# Patient Record
Sex: Male | Born: 1940
Health system: Southern US, Community
[De-identification: ages and names within clinical notes are randomized; demographics above are authoritative.]

## PROBLEM LIST (undated history)

## (undated) DIAGNOSIS — E785 Hyperlipidemia, unspecified: Secondary | ICD-10-CM

## (undated) DIAGNOSIS — I10 Essential (primary) hypertension: Secondary | ICD-10-CM

---

## 2008-03-23 ENCOUNTER — Ambulatory Visit: Payer: Self-pay | Admitting: Occupational Medicine

## 2008-03-23 DIAGNOSIS — I10 Essential (primary) hypertension: Secondary | ICD-10-CM | POA: Insufficient documentation

## 2008-03-23 DIAGNOSIS — E785 Hyperlipidemia, unspecified: Secondary | ICD-10-CM | POA: Insufficient documentation

## 2008-03-23 DIAGNOSIS — S42199A Fracture of other part of scapula, unspecified shoulder, initial encounter for closed fracture: Secondary | ICD-10-CM

## 2008-03-31 ENCOUNTER — Encounter: Admission: RE | Admit: 2008-03-31 | Discharge: 2008-03-31 | Payer: Self-pay | Admitting: Sports Medicine

## 2008-04-21 ENCOUNTER — Encounter: Admission: RE | Admit: 2008-04-21 | Discharge: 2008-04-21 | Payer: Self-pay | Admitting: Sports Medicine

## 2013-02-18 ENCOUNTER — Encounter: Payer: Self-pay | Admitting: Emergency Medicine

## 2013-02-18 ENCOUNTER — Emergency Department (INDEPENDENT_AMBULATORY_CARE_PROVIDER_SITE_OTHER)
Admission: EM | Admit: 2013-02-18 | Discharge: 2013-02-18 | Disposition: A | Discharge status: Home / Self Care | Payer: Medicare Other | Source: Home / Self Care | Attending: Emergency Medicine | Admitting: Emergency Medicine

## 2013-02-18 ENCOUNTER — Emergency Department (INDEPENDENT_AMBULATORY_CARE_PROVIDER_SITE_OTHER): Payer: Medicare Other

## 2013-02-18 ENCOUNTER — Ambulatory Visit (INDEPENDENT_AMBULATORY_CARE_PROVIDER_SITE_OTHER): Payer: Medicare Other | Admitting: Sports Medicine

## 2013-02-18 DIAGNOSIS — M25062 Hemarthrosis, left knee: Secondary | ICD-10-CM

## 2013-02-18 DIAGNOSIS — S8002XA Contusion of left knee, initial encounter: Secondary | ICD-10-CM

## 2013-02-18 DIAGNOSIS — M25069 Hemarthrosis, unspecified knee: Secondary | ICD-10-CM

## 2013-02-18 DIAGNOSIS — M6281 Muscle weakness (generalized): Secondary | ICD-10-CM | POA: Insufficient documentation

## 2013-02-18 DIAGNOSIS — R296 Repeated falls: Secondary | ICD-10-CM

## 2013-02-18 DIAGNOSIS — M25469 Effusion, unspecified knee: Secondary | ICD-10-CM

## 2013-02-18 DIAGNOSIS — S8000XA Contusion of unspecified knee, initial encounter: Secondary | ICD-10-CM

## 2013-02-18 HISTORY — DX: Hyperlipidemia, unspecified: E78.5

## 2013-02-18 HISTORY — DX: Essential (primary) hypertension: I10

## 2013-02-18 NOTE — ED Provider Notes (Addendum)
CSN: [id number]     Arrival date & time 02/18/13  0807 History   First MD Initiated Contact with Patient 02/18/13 [phone]     Chief Complaint  Patient presents with  . Knee Injury   Patient is a 73 y.o. male presenting with knee pain. The history is provided by the patient and the spouse.  Knee Pain Location:  Knee Time since incident:  2 days Injury: yes   Mechanism of injury: fall   Mechanism of injury comment:  Fell on left anterior knee or walking on carpet Fall:    Fall occurred:  Tripped   Impact surface:  Carpet   Entrapped after fall: no   Knee location:  L knee Pain details:    Quality:  Dull   Radiates to:  Does not radiate   Severity:  Moderate   Timing:  Intermittent   Progression:  Worsening Chronicity:  New Prior injury to area:  No Relieved by:  Rest and ice Worsened by:  Bearing weight Associated symptoms: decreased ROM, muscle weakness (Chronic neuropathy left leg since 2001, followed by neurologist), stiffness and swelling   Associated symptoms: no back pain, no fever, no itching and no neck pain   Risk factors: no obesity and no recent illness    He states he is on blood thinners. Aspirin 81 mg daily and Ticagrelor Past Medical History  Diagnosis Date  . Hypertension   . Hyperlipidemia    History reviewed. No pertinent past surgical history. No family history on file. History  Substance Use Topics  . Smoking status: Former Smoker    Types: Cigarettes    Quit date: 02/18/1973  . Smokeless tobacco: Not on file  . Alcohol Use: Yes    Review of Systems  Constitutional: Negative for fever.  Musculoskeletal: Positive for stiffness. Negative for back pain and neck pain.  Skin: Negative for itching.  All other systems reviewed and are negative.    Allergies  Review of patient's allergies indicates no known allergies.  Home Medications   Current Outpatient Rx  Name  Route  Sig  Dispense  Refill  . aspirin 81 MG tablet   Oral   Take 81 mg by  mouth daily.         [name] atenolol (TENORMIN) 50 MG tablet   Oral   Take 50 mg by mouth daily.         [name] atorvastatin (LIPITOR) 80 MG tablet   Oral   Take 80 mg by mouth daily.         . sertraline (ZOLOFT) 25 MG tablet   Oral   Take 25 mg by mouth daily.         . Ticagrelor (BRILINTA) 90 MG TABS tablet   Oral   Take by mouth 2 (two) times daily.          BP 130/84  Pulse 53  Temp(Src) 98.6 F (37 C) (Oral)  Ht 6\' 1"  (1.854 m)  Wt 186 lb (84.369 kg)  BMI 24.55 kg/m2  SpO2 96% Physical Exam  Nursing note and vitals reviewed. Constitutional: He is oriented to person, place, and time. He appears well-developed and well-nourished. No distress.  Uncomfortable from left knee pain.  HENT:  Head: Normocephalic and atraumatic.  Eyes: Conjunctivae and EOM are normal. Pupils are equal, round, and reactive to light. No scleral icterus.  Neck: Normal range of motion.  Cardiovascular: Normal rate.   Pulmonary/Chest: Effort normal.  Abdominal: He exhibits no distension.  Musculoskeletal:  Left knee: He exhibits decreased range of motion and swelling (4+). He exhibits no laceration. Tenderness (Diffusely, especially patella) found. No medial joint line, no lateral joint line and no patellar tendon tenderness noted.  Some chronic atrophy of left quadriceps muscle, which she states is from chronic neuropathy of left leg.  Left knee swollen, mildly discolored, minimal warmth .  Superficial abrasion left anterior knee, no drainage or bleeding  Neurological: He is alert and oriented to person, place, and time. No cranial nerve deficit.  Skin: Skin is warm.  Psychiatric: He has a normal mood and affect.   Vascular good capillary refill distally intact both extremities. No calf tenderness or cords. ED Course  Procedures (including critical care time) Labs Review Labs Reviewed - No data to display Imaging Review Dg Knee Complete 4 Views Left  02/18/2013   CLINICAL DATA:   Knee injury.  EXAM: LEFT KNEE - COMPLETE 4+ VIEW  COMPARISON:  None.  FINDINGS: Prominent knee joint effusion is present. Prominent tricompartment degenerative change. Femur and tibia are intact. Subtle fractures of the superior and or inferior aspects of the patella cannot be completely excluded. These changes may be related to ligamentous ossification . CT can be obtained for further evaluation.  IMPRESSION: 1. Large knee joint effusion. 2. Cannot exclude subtle fractures of the superior and/ or inferior aspect of the patella. CT can be obtained for further evaluation. 3. Severe tricompartment degenerative change.   Electronically Signed   By: Marcello Moores  Register   On: 02/18/2013 09:09    EKG Interpretation    Date/Time:    Ventricular Rate:    PR Interval:    QRS Duration:   QT Interval:    QTC Calculation:   R Axis:     Text Interpretation:              MDM   1. Contusion of left knee   2. Hemarthrosis of left knee    Severe left knee injury/contusion. With large joint effusion.--As he is on anticoagulant, I'm suspicious of a large hematoma/blood joint effusion, but other causes possible. We'll arrange stat referral to Dr. Dianah Field, sports medicine/outpatient orthopedic specialist, who will come to see patient in our facility for consultation.  Addendum: 02/18/2013  1140 Dr. Dianah Field came to our urgent care facility and evaluated patient and reviewed x-rays and he performed arthrocentesis under ultrasound guidance, and removed 80 cc of bloody fluid. And he also injected a steroid/anesthetic into left knee joint space and patient did well and his pain was significantly improved and the swelling was much improved. The Ace bandage and knee immobilizer applied. He declined any prescription pain medication and she prefers to use Tylenol as his pain is much improved after the procedure by Dr. Dianah Field. He'll followup in 2 weeks with Dr. Dianah Field . Precautions  discussed. Red flags discussed. Questions invited and answered. Patient and wife voiced understanding and agreement.    Jacqulyn Cane, MD 02/18/13 1225  Over 45 minutes spent, greater than 50% of the time spent for counseling and coordination of care.   Jacqulyn Cane, MD 02/18/13 1229

## 2013-02-18 NOTE — Assessment & Plan Note (Signed)
This represents osteoarthritis, with posttraumatic hemarthrosis. I aspirated 80 cc of gross blood out of the knee, and then injected. The was then strapped with compressive dressing and placed in a knee immobilizer. He will come back to see me in 3 weeks.

## 2013-02-18 NOTE — ED Notes (Signed)
Left knee injury from fall yesterday while walking up a ramp

## 2013-02-18 NOTE — Progress Notes (Addendum)
   Subjective:    I'm seeing this patient as a consultation for:  Dr. Burnett Harry  CC: Left knee pain  HPI: This is a very pleasant 73 year old male with a baseline history of lower extremity neuropathy with essentially 0/5 baseline knee extension strength bilaterally, as well as bilateral foot drop. He typically ambulates with a cane. Recently he fell directly onto his left knee causing immediate pain, and swelling. He came here for further evaluation and definitive treatment. Pain is moderate, persistent, localized in the suprapatellar recess at the posterior aspect of the knee.  Past medical history, Surgical history, Family history not pertinant except as noted below, Social history, Allergies, and medications have been entered into the medical record, reviewed, and no changes needed.   Review of Systems: No headache, visual changes, nausea, vomiting, diarrhea, constipation, dizziness, abdominal pain, skin rash, fevers, chills, night sweats, weight loss, swollen lymph nodes, body aches, joint swelling, muscle aches, chest pain, shortness of breath, mood changes, visual or auditory hallucinations.   Objective:   General: Well Developed, well nourished, and in no acute distress.  Neuro/Psych: Alert and oriented x3, extra-ocular muscles intact, able to move all 4 extremities, sensation grossly intact. Skin: Warm and dry, no rashes noted.  Respiratory: Not using accessory muscles, speaking in full sentences, trachea midline.  Cardiovascular: Pulses palpable, no extremity edema. Abdomen: Does not appear distended. Left knee: Moderate effusion palpable and visible, there is a fluid wave, he is tender to palpation along the anteromedial joint line, there is no tenderness along the superior or inferior poles or the body of the patella. He has 0/5 extension strength on both knees. There are abrasions on both the left and the right knees, these were dressed with antibiotic ointment.  Procedure: Limited  ultrasound of left knee Device: GE logiq E. Findings: There is a large effusion of hypoechoic material suggestive of hemarthrosis. Patellar tendon is intact and there are no visible fractures around the kneecap. Quadriceps tendon and its insertion are also intact Impression: Hemarthrosis with intact extensor apparatus. Images permanently stored in the unit and are available for review.  Procedure: Real-time Ultrasound Guided aspiration/Injection of left knee Device: GE Logiq E  Verbal informed consent obtained.  Time-out conducted.  Noted no overlying erythema, induration, or other signs of local infection.  Skin prepped in a sterile fashion.  Local anesthesia: Topical Ethyl chloride.  With sterile technique and under real time ultrasound guidance:  22-gauge needle advanced into the suprapatellar recess, 80 cc of frank blood was aspirated, syringe switched and 2 cc Kenalog 40, 4 cc lidocaine injected easily. Completed without difficulty  Pain immediately resolved suggesting accurate placement of the medication.  Advised to call if fevers/chills, erythema, induration, drainage, or persistent bleeding.  Images permanently stored and available for review in the ultrasound unit.  Impression: Technically successful ultrasound guided injection.  The knee was then strapped compressive dressing.  Impression and Recommendations:   This case required medical decision making of moderate complexity.  I spent 60 minutes with this patient, greater than 50% was face-to-face time counseling regarding the above diagnosis, time spent was performing initial assessment, and management before, and after the injection but not during.

## 2013-02-19 ENCOUNTER — Institutional Professional Consult (permissible substitution): Payer: Medicare Other | Admitting: Sports Medicine

## 2013-03-11 ENCOUNTER — Ambulatory Visit (INDEPENDENT_AMBULATORY_CARE_PROVIDER_SITE_OTHER): Payer: Medicare Other | Admitting: Sports Medicine

## 2013-03-11 ENCOUNTER — Encounter: Payer: Self-pay | Admitting: Sports Medicine

## 2013-03-11 VITALS — BP 120/81 | HR 60 | Ht 73.0 in | Wt 189.0 lb

## 2013-03-11 DIAGNOSIS — M25062 Hemarthrosis, left knee: Secondary | ICD-10-CM

## 2013-03-11 DIAGNOSIS — M25069 Hemarthrosis, unspecified knee: Secondary | ICD-10-CM

## 2013-03-11 NOTE — Assessment & Plan Note (Signed)
Pain-free. Return as needed.

## 2013-03-11 NOTE — Progress Notes (Signed)
  Subjective:    CC: Followup  HPI: Left knee: Russell Neal returns after aspiration and injection of a traumatic left knee hemarthrosis with underlying osteoarthritis. He is completely pain-free.  Past medical history, Surgical history, Family history not pertinant except as noted below, Social history, Allergies, and medications have been entered into the medical record, reviewed, and no changes needed.   Review of Systems: No fevers, chills, night sweats, weight loss, chest pain, or shortness of breath.   Objective:    General: Well Developed, well nourished, and in no acute distress.  Neuro: Alert and oriented x3, extra-ocular muscles intact, sensation grossly intact.  HEENT: Normocephalic, atraumatic, pupils equal round reactive to light, neck supple, no masses, no lymphadenopathy, thyroid nonpalpable.  Skin: Warm and dry, no rashes. Cardiac: Regular rate and rhythm, no murmurs rubs or gallops, no lower extremity edema.  Respiratory: Clear to auscultation bilaterally. Not using accessory muscles, speaking in full sentences. Left Knee: Normal to inspection with no erythema or effusion or obvious bony abnormalities. Palpation normal with no warmth, joint line tenderness, patellar tenderness, or condyle tenderness. ROM full in flexion and extension and lower leg rotation. Ligaments with solid consistent endpoints including ACL, PCL, LCL, MCL. Negative Mcmurray's, Apley's, and Thessalonian tests. Non painful patellar compression. Patellar glide without crepitus. Patellar and quadriceps tendons unremarkable. Hamstring and quadriceps strength is normal.   Impression and Recommendations:

## 2013-12-30 ENCOUNTER — Ambulatory Visit (INDEPENDENT_AMBULATORY_CARE_PROVIDER_SITE_OTHER): Payer: Medicare Other | Admitting: Sports Medicine

## 2013-12-30 ENCOUNTER — Encounter: Payer: Self-pay | Admitting: Sports Medicine

## 2013-12-30 VITALS — BP 129/82 | HR 55 | Ht 73.0 in | Wt 187.0 lb

## 2013-12-30 DIAGNOSIS — M7021 Olecranon bursitis, right elbow: Secondary | ICD-10-CM

## 2013-12-30 NOTE — Progress Notes (Signed)
  Subjective:    CC: Right elbow swelling  HPI: This is a very pleasant 73 year old male, he does take Brilinta, he recently had a fall onto his right elbow that resulted in immediate swelling over the olecranon. Right now it's really not painful does have good use of his elbow. Symptoms are moderate, persistent. no radiation.  Past medical history, Surgical history, Family history not pertinant except as noted below, Social history, Allergies, and medications have been entered into the medical record, reviewed, and no changes needed.   Review of Systems: No fevers, chills, night sweats, weight loss, chest pain, or shortness of breath.   Objective:    General: Well Developed, well nourished, and in no acute distress.  Neuro: Alert and oriented x3, extra-ocular muscles intact, sensation grossly intact.  HEENT: Normocephalic, atraumatic, pupils equal round reactive to light, neck supple, no masses, no lymphadenopathy, thyroid nonpalpable.  Skin: Warm and dry, no rashes. Cardiac: Regular rate and rhythm, no murmurs rubs or gallops, no lower extremity edema.  Respiratory: Clear to auscultation bilaterally. Not using accessory muscles, speaking in full sentences. Left Elbow: Swollen olecranon bursa Range of motion full pronation, supination, flexion, extension. Strength is full to all of the above directions Stable to varus, valgus stress. Negative moving valgus stress test. No discrete areas of tenderness to palpation. Ulnar nerve does not sublux. Negative cubital tunnel Tinel's.  Procedure: Real-time Ultrasound Guided Injection of right olecranon bursa Device: GE Logiq E  Ultrasound guided injection is preferred based studies that show increased duration, increased effect, greater accuracy, decreased procedural pain, increased response rate, and decreased cost with ultrasound guided versus blind injection.  Verbal informed consent obtained.  Time-out conducted.  Noted no overlying  erythema, induration, or other signs of local infection.  Skin prepped in a sterile fashion.  Local anesthesia: Topical Ethyl chloride.  With sterile technique and under real time ultrasound guidance:  Aspirate 18cc frank blood from the bursa, syringe switched and 1cc kenalog 40, 1cc lidocaine injected easily. Completed without difficulty  Pain immediately resolved suggesting accurate placement of the medication.  Advised to call if fevers/chills, erythema, induration, drainage, or persistent bleeding.  Images permanently stored and available for review in the ultrasound unit.  Impression: Technically successful ultrasound guided injection.  Right elbow was then strapped with compressive dressing.  Impression and Recommendations:

## 2013-12-30 NOTE — Assessment & Plan Note (Addendum)
Likely hemorrhagic bursitis in a patient with trauma and/or blood thinners. Aspiration of 18 mL of frank blood from the olecranon bursa and injection as above, strapped with compressive dressing. Return to see me in one month.

## 2014-02-03 ENCOUNTER — Ambulatory Visit (INDEPENDENT_AMBULATORY_CARE_PROVIDER_SITE_OTHER): Payer: Medicare Other | Admitting: Sports Medicine

## 2014-02-03 ENCOUNTER — Encounter: Payer: Self-pay | Admitting: Sports Medicine

## 2014-02-03 VITALS — BP 134/78 | HR 54 | Wt 187.0 lb

## 2014-02-03 DIAGNOSIS — M25462 Effusion, left knee: Secondary | ICD-10-CM

## 2014-02-03 DIAGNOSIS — M7042 Prepatellar bursitis, left knee: Secondary | ICD-10-CM | POA: Insufficient documentation

## 2014-02-03 DIAGNOSIS — M7021 Olecranon bursitis, right elbow: Secondary | ICD-10-CM

## 2014-02-03 NOTE — Assessment & Plan Note (Signed)
Completely resolved after aspiration and injection. It was atraumatic, hemorrhagic bursitis.

## 2014-02-03 NOTE — Progress Notes (Signed)
  Subjective:    CC: Follow-up  HPI: This is a pleasant 74 year old male, I saw him a month ago for a hemorrhagic right olecranon bursitis which we aspirated and injected, this resolved. Unfortunately he had another fall onto his left knee, he developed swelling over the anterior aspect of the kneecap that was moderate, persistent with regards to pain. It has improved slightly but he continues to have pain at the joint lines and anterior patella.  Past medical history, Surgical history, Family history not pertinant except as noted below, Social history, Allergies, and medications have been entered into the medical record, reviewed, and no changes needed.   Review of Systems: No fevers, chills, night sweats, weight loss, chest pain, or shortness of breath.   Objective:    General: Well Developed, well nourished, and in no acute distress.  Neuro: Alert and oriented x3, extra-ocular muscles intact, sensation grossly intact.  HEENT: Normocephalic, atraumatic, pupils equal round reactive to light, neck supple, no masses, no lymphadenopathy, thyroid nonpalpable.  Skin: Warm and dry, no rashes. Cardiac: Regular rate and rhythm, no murmurs rubs or gallops, no lower extremity edema.  Respiratory: Clear to auscultation bilaterally. Not using accessory muscles, speaking in full sentences. Right Elbow: Unremarkable to inspection. Range of motion full pronation, supination, flexion, extension. Strength is full to all of the above directions Stable to varus, valgus stress. Negative moving valgus stress test. No discrete areas of tenderness to palpation. Ulnar nerve does not sublux. Negative cubital tunnel Tinel's. Left Knee: Visible fullness and tenderness to palpation over the prepatellar bursa. There is also pain at the joint line. ROM normal in flexion and extension and lower leg rotation. Ligaments with solid consistent endpoints including ACL, PCL, LCL, MCL. Negative Mcmurray's and provocative  meniscal tests. Non painful patellar compression. Patellar and quadriceps tendons unremarkable. Hamstring and quadriceps strength is normal.  Procedure: Real-time Ultrasound Guided Injection of left prepatellar bursa Device: GE Logiq E  Verbal informed consent obtained.  Time-out conducted.  Noted no overlying erythema, induration, or other signs of local infection.  Skin prepped in a sterile fashion.  Local anesthesia: Topical Ethyl chloride.  With sterile technique and under real time ultrasound guidance:  0.5 mL kenalog 40, 0.5 mL lidocaine injected easily. Completed without difficulty  Pain immediately resolved suggesting accurate placement of the medication.  Advised to call if fevers/chills, erythema, induration, drainage, or persistent bleeding.  Images permanently stored and available for review in the ultrasound unit.  Impression: Technically successful ultrasound guided injection.  Procedure: Real-time Ultrasound Guided Injection of left knee Device: GE Logiq E  Verbal informed consent obtained.  Time-out conducted.  Noted no overlying erythema, induration, or other signs of local infection.  Skin prepped in a sterile fashion.  Local anesthesia: Topical Ethyl chloride.  With sterile technique and under real time ultrasound guidance: Noted intra-articular effusion, 0.5 mL kenalog 40, 0.5 mL lidocaine injected easily. Completed without difficulty  Pain immediately resolved suggesting accurate placement of the medication.  Advised to call if fevers/chills, erythema, induration, drainage, or persistent bleeding.  Images permanently stored and available for review in the ultrasound unit.  Impression: Technically successful ultrasound guided injection.  Impression and Recommendations:

## 2014-02-03 NOTE — Assessment & Plan Note (Addendum)
This represents Russell Neal's third fall. Injection of his left hemorrhagic prepatellar bursitis that has been present and persistent for one month. We also injected the knee joint as there was an effusion. I'm going to get him into formal physical therapy for balance and gait training. He will continue to use his cane.

## 2014-02-10 ENCOUNTER — Ambulatory Visit (INDEPENDENT_AMBULATORY_CARE_PROVIDER_SITE_OTHER): Payer: Medicare Other | Admitting: Physical Therapy

## 2014-02-10 DIAGNOSIS — M7042 Prepatellar bursitis, left knee: Secondary | ICD-10-CM | POA: Diagnosis not present

## 2014-02-10 DIAGNOSIS — M6281 Muscle weakness (generalized): Secondary | ICD-10-CM

## 2014-02-10 DIAGNOSIS — R262 Difficulty in walking, not elsewhere classified: Secondary | ICD-10-CM

## 2014-02-14 ENCOUNTER — Encounter (INDEPENDENT_AMBULATORY_CARE_PROVIDER_SITE_OTHER): Payer: Medicare Other | Admitting: Physical Therapy

## 2014-02-14 DIAGNOSIS — R262 Difficulty in walking, not elsewhere classified: Secondary | ICD-10-CM

## 2014-02-14 DIAGNOSIS — M7042 Prepatellar bursitis, left knee: Secondary | ICD-10-CM

## 2014-02-14 DIAGNOSIS — M6281 Muscle weakness (generalized): Secondary | ICD-10-CM

## 2014-02-17 ENCOUNTER — Encounter: Payer: Medicare Other | Admitting: Physical Therapy

## 2014-02-17 ENCOUNTER — Encounter (INDEPENDENT_AMBULATORY_CARE_PROVIDER_SITE_OTHER): Payer: Medicare Other | Admitting: Physical Therapy

## 2014-02-17 DIAGNOSIS — M6281 Muscle weakness (generalized): Secondary | ICD-10-CM | POA: Diagnosis not present

## 2014-02-17 DIAGNOSIS — M7042 Prepatellar bursitis, left knee: Secondary | ICD-10-CM | POA: Diagnosis not present

## 2014-02-17 DIAGNOSIS — R262 Difficulty in walking, not elsewhere classified: Secondary | ICD-10-CM

## 2014-03-10 ENCOUNTER — Ambulatory Visit (INDEPENDENT_AMBULATORY_CARE_PROVIDER_SITE_OTHER): Payer: Medicare Other | Admitting: Sports Medicine

## 2014-03-10 ENCOUNTER — Encounter: Payer: Self-pay | Admitting: Sports Medicine

## 2014-03-10 VITALS — BP 147/86 | HR 105 | Ht 73.0 in | Wt 187.0 lb

## 2014-03-10 DIAGNOSIS — M7042 Prepatellar bursitis, left knee: Secondary | ICD-10-CM

## 2014-03-10 DIAGNOSIS — M6281 Muscle weakness (generalized): Secondary | ICD-10-CM

## 2014-03-10 NOTE — Assessment & Plan Note (Signed)
Pain is now completely resolved after injection into a hemorrhagic prepatellar bursitis.  Return as needed.

## 2014-03-10 NOTE — Assessment & Plan Note (Signed)
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 He does well with a simple cane, we did discuss ankle foot orthoses as well as an artificial quadriceps orthosis, he is going to think about this. He has had several falls over the past few months.

## 2014-03-10 NOTE — Progress Notes (Signed)
  Subjective:    CC: Follow-up  HPI: Hemorrhagic olecranon bursitis: Resolved  Left hemorrhagic prepatellar bursitis: Resolved with aspiration and injection.  Left knee pain: Doing well, he does have bilateral quadriceps insufficiency as well as mild bilateral foot drop, I did recommend an artificial quadriceps orthosis as well as an ankle-foot orthosis for his foot drop, he is going to think about this.  Past medical history, Surgical history, Family history not pertinant except as noted below, Social history, Allergies, and medications have been entered into the medical record, reviewed, and no changes needed.   Review of Systems: No fevers, chills, night sweats, weight loss, chest pain, or shortness of breath.   Objective:    General: Well Developed, well nourished, and in no acute distress.  Neuro: Alert and oriented x3, extra-ocular muscles intact, sensation grossly intact.  HEENT: Normocephalic, atraumatic, pupils equal round reactive to light, neck supple, no masses, no lymphadenopathy, thyroid nonpalpable.  Skin: Warm and dry, no rashes. Cardiac: Regular rate and rhythm, no murmurs rubs or gallops, no lower extremity edema.  Respiratory: Clear to auscultation bilaterally. Not using accessory muscles, speaking in full sentences.  Impression and Recommendations:

## 2014-04-13 DIAGNOSIS — L570 Actinic keratosis: Secondary | ICD-10-CM | POA: Diagnosis not present

## 2014-04-13 DIAGNOSIS — Z08 Encounter for follow-up examination after completed treatment for malignant neoplasm: Secondary | ICD-10-CM | POA: Diagnosis not present

## 2014-04-13 DIAGNOSIS — Z85820 Personal history of malignant melanoma of skin: Secondary | ICD-10-CM | POA: Diagnosis not present

## 2014-04-18 DIAGNOSIS — H25013 Cortical age-related cataract, bilateral: Secondary | ICD-10-CM | POA: Diagnosis not present

## 2014-04-24 DIAGNOSIS — I10 Essential (primary) hypertension: Secondary | ICD-10-CM | POA: Diagnosis not present

## 2014-04-24 DIAGNOSIS — N529 Male erectile dysfunction, unspecified: Secondary | ICD-10-CM | POA: Diagnosis not present

## 2014-04-24 DIAGNOSIS — K219 Gastro-esophageal reflux disease without esophagitis: Secondary | ICD-10-CM | POA: Diagnosis not present

## 2014-04-24 DIAGNOSIS — Z79899 Other long term (current) drug therapy: Secondary | ICD-10-CM | POA: Diagnosis not present

## 2014-04-24 DIAGNOSIS — E039 Hypothyroidism, unspecified: Secondary | ICD-10-CM | POA: Diagnosis not present

## 2014-04-24 DIAGNOSIS — M545 Low back pain: Secondary | ICD-10-CM | POA: Diagnosis not present

## 2014-04-24 DIAGNOSIS — F17201 Nicotine dependence, unspecified, in remission: Secondary | ICD-10-CM | POA: Diagnosis not present

## 2014-04-24 DIAGNOSIS — M549 Dorsalgia, unspecified: Secondary | ICD-10-CM | POA: Diagnosis not present

## 2014-04-24 DIAGNOSIS — I739 Peripheral vascular disease, unspecified: Secondary | ICD-10-CM | POA: Diagnosis not present

## 2014-04-24 DIAGNOSIS — G629 Polyneuropathy, unspecified: Secondary | ICD-10-CM | POA: Diagnosis not present

## 2014-04-24 DIAGNOSIS — M47816 Spondylosis without myelopathy or radiculopathy, lumbar region: Secondary | ICD-10-CM | POA: Diagnosis not present

## 2014-04-24 DIAGNOSIS — M4316 Spondylolisthesis, lumbar region: Secondary | ICD-10-CM | POA: Diagnosis not present

## 2014-04-24 DIAGNOSIS — I340 Nonrheumatic mitral (valve) insufficiency: Secondary | ICD-10-CM | POA: Diagnosis not present

## 2014-04-24 DIAGNOSIS — I2510 Atherosclerotic heart disease of native coronary artery without angina pectoris: Secondary | ICD-10-CM | POA: Diagnosis not present

## 2014-04-24 DIAGNOSIS — E785 Hyperlipidemia, unspecified: Secondary | ICD-10-CM | POA: Diagnosis not present

## 2014-04-24 DIAGNOSIS — Z7982 Long term (current) use of aspirin: Secondary | ICD-10-CM | POA: Diagnosis not present

## 2014-06-23 DIAGNOSIS — E782 Mixed hyperlipidemia: Secondary | ICD-10-CM | POA: Diagnosis not present

## 2014-06-23 DIAGNOSIS — G7241 Inclusion body myositis [IBM]: Secondary | ICD-10-CM | POA: Diagnosis not present

## 2014-06-23 DIAGNOSIS — Z79899 Other long term (current) drug therapy: Secondary | ICD-10-CM | POA: Diagnosis not present

## 2014-06-23 DIAGNOSIS — R7302 Impaired glucose tolerance (oral): Secondary | ICD-10-CM | POA: Diagnosis not present

## 2014-06-23 DIAGNOSIS — E049 Nontoxic goiter, unspecified: Secondary | ICD-10-CM | POA: Diagnosis not present

## 2014-06-23 DIAGNOSIS — D519 Vitamin B12 deficiency anemia, unspecified: Secondary | ICD-10-CM | POA: Diagnosis not present

## 2014-06-28 DIAGNOSIS — Z23 Encounter for immunization: Secondary | ICD-10-CM | POA: Diagnosis not present

## 2014-06-28 DIAGNOSIS — E0590 Thyrotoxicosis, unspecified without thyrotoxic crisis or storm: Secondary | ICD-10-CM | POA: Diagnosis not present

## 2014-06-28 DIAGNOSIS — Z0001 Encounter for general adult medical examination with abnormal findings: Secondary | ICD-10-CM | POA: Diagnosis not present

## 2014-06-28 DIAGNOSIS — D519 Vitamin B12 deficiency anemia, unspecified: Secondary | ICD-10-CM | POA: Diagnosis not present

## 2014-06-28 DIAGNOSIS — E782 Mixed hyperlipidemia: Secondary | ICD-10-CM | POA: Diagnosis not present

## 2014-06-28 DIAGNOSIS — F329 Major depressive disorder, single episode, unspecified: Secondary | ICD-10-CM | POA: Diagnosis not present

## 2014-06-28 DIAGNOSIS — E049 Nontoxic goiter, unspecified: Secondary | ICD-10-CM | POA: Diagnosis not present

## 2014-06-28 DIAGNOSIS — G519 Disorder of facial nerve, unspecified: Secondary | ICD-10-CM | POA: Diagnosis not present

## 2014-10-13 DIAGNOSIS — I2583 Coronary atherosclerosis due to lipid rich plaque: Secondary | ICD-10-CM | POA: Diagnosis not present

## 2014-10-13 DIAGNOSIS — I2510 Atherosclerotic heart disease of native coronary artery without angina pectoris: Secondary | ICD-10-CM | POA: Diagnosis not present

## 2014-10-13 DIAGNOSIS — E0590 Thyrotoxicosis, unspecified without thyrotoxic crisis or storm: Secondary | ICD-10-CM | POA: Diagnosis not present

## 2014-10-19 DIAGNOSIS — L570 Actinic keratosis: Secondary | ICD-10-CM | POA: Diagnosis not present

## 2014-10-19 DIAGNOSIS — Z08 Encounter for follow-up examination after completed treatment for malignant neoplasm: Secondary | ICD-10-CM | POA: Diagnosis not present

## 2014-10-19 DIAGNOSIS — Z85828 Personal history of other malignant neoplasm of skin: Secondary | ICD-10-CM | POA: Diagnosis not present

## 2014-11-16 DIAGNOSIS — Z23 Encounter for immunization: Secondary | ICD-10-CM | POA: Diagnosis not present

## 2014-12-26 DIAGNOSIS — I10 Essential (primary) hypertension: Secondary | ICD-10-CM | POA: Diagnosis not present

## 2014-12-26 DIAGNOSIS — E049 Nontoxic goiter, unspecified: Secondary | ICD-10-CM | POA: Diagnosis not present

## 2014-12-26 DIAGNOSIS — E0590 Thyrotoxicosis, unspecified without thyrotoxic crisis or storm: Secondary | ICD-10-CM | POA: Diagnosis not present

## 2014-12-26 DIAGNOSIS — D519 Vitamin B12 deficiency anemia, unspecified: Secondary | ICD-10-CM | POA: Diagnosis not present

## 2014-12-27 ENCOUNTER — Ambulatory Visit (INDEPENDENT_AMBULATORY_CARE_PROVIDER_SITE_OTHER): Payer: Medicare Other | Admitting: Sports Medicine

## 2014-12-27 ENCOUNTER — Encounter: Payer: Self-pay | Admitting: Sports Medicine

## 2014-12-27 ENCOUNTER — Ambulatory Visit (INDEPENDENT_AMBULATORY_CARE_PROVIDER_SITE_OTHER): Payer: Medicare Other

## 2014-12-27 DIAGNOSIS — M7042 Prepatellar bursitis, left knee: Secondary | ICD-10-CM

## 2014-12-27 DIAGNOSIS — M25462 Effusion, left knee: Secondary | ICD-10-CM | POA: Diagnosis not present

## 2014-12-27 NOTE — Progress Notes (Signed)
  Subjective:    CC:  Left knee pain  HPI: This is a pleasant 74 year old male with known left knee osteoarthritis, and baseline inability to extend his left knee. Recently he had a fall, he had subsequent pain, swelling over his left knee, predominantly at the medial and lateral joint lines, no mechanical symptoms. Pain is severe, persistent. No radiation.  Past medical history, Surgical history, Family history not pertinant except as noted below, Social history, Allergies, and medications have been entered into the medical record, reviewed, and no changes needed.   Review of Systems: No fevers, chills, night sweats, weight loss, chest pain, or shortness of breath.   Objective:    General: Well Developed, well nourished, and in no acute distress.  Neuro: Alert and oriented x3, extra-ocular muscles intact, sensation grossly intact.  HEENT: Normocephalic, atraumatic, pupils equal round reactive to light, neck supple, no masses, no lymphadenopathy, thyroid nonpalpable.  Skin: Warm and dry, no rashes. Cardiac: Regular rate and rhythm, no murmurs rubs or gallops, no lower extremity edema.  Respiratory: Clear to auscultation bilaterally. Not using accessory muscles, speaking in full sentences. Left knee: Visibly swollen, bruised with a small abrasion over the patella, there is patellar ballotable as well as a palpable fluid wave with an effusion. He has in ability to extend the knee, but an intact and palpable patellar tendon. There is severe tenderness at the medial and lateral joint line's.  X-rays personally reviewed and showed no evidence of fracture, there is widespread osteoarthritis.  Procedure: Real-time Ultrasound Guided aspiration/Injection of left knee Device: GE Logiq E  Verbal informed consent obtained.  Time-out conducted.  Noted no overlying erythema, induration, or other signs of local infection.  Skin prepped in a sterile fashion.  Local anesthesia: Topical Ethyl chloride.    With sterile technique and under real time ultrasound guidance:  Aspirated 26 mL gross blood, syringe switched and 1 mL kenalog 40, 4 mL lidocaine injected easily. Completed without difficulty  Pain immediately resolved suggesting accurate placement of the medication.  Advised to call if fevers/chills, erythema, induration, drainage, or persistent bleeding.  Images permanently stored and available for review in the ultrasound unit.  Impression: Technically successful ultrasound guided injection.  The knee was then strapped with compressive dressing.  Impression and Recommendations:

## 2014-12-27 NOTE — Assessment & Plan Note (Addendum)
Recent fall with trauma, pain and swelling over the left knee, palpable effusion, there is exquisite pain over the tibial plateau. Before considering aspiration and injection we are going to obtain an x-ray. He is unable to extend the knee at baseline.  No fractures, aspiration of gross hemarthrosis with injection. Strapped with compressive dressing, return to see me in one month.

## 2015-01-18 DIAGNOSIS — L570 Actinic keratosis: Secondary | ICD-10-CM | POA: Diagnosis not present

## 2015-01-18 DIAGNOSIS — Z85820 Personal history of malignant melanoma of skin: Secondary | ICD-10-CM | POA: Diagnosis not present

## 2015-01-18 DIAGNOSIS — Z08 Encounter for follow-up examination after completed treatment for malignant neoplasm: Secondary | ICD-10-CM | POA: Diagnosis not present

## 2015-02-02 ENCOUNTER — Encounter: Payer: Self-pay | Admitting: Sports Medicine

## 2015-02-02 ENCOUNTER — Ambulatory Visit (INDEPENDENT_AMBULATORY_CARE_PROVIDER_SITE_OTHER): Payer: Medicare Other | Admitting: Sports Medicine

## 2015-02-02 VITALS — BP 122/71 | HR 58 | Temp 97.8°F | Resp 16 | Wt 184.9 lb

## 2015-02-02 DIAGNOSIS — M7042 Prepatellar bursitis, left knee: Secondary | ICD-10-CM | POA: Diagnosis not present

## 2015-02-02 NOTE — Progress Notes (Signed)
  Subjective:    CC: Follow-up  HPI: Left knee osteoarthritis: Resolved after aspiration and injection at the last visit, doing well and pain-free, happy with results.  Past medical history, Surgical history, Family history not pertinant except as noted below, Social history, Allergies, and medications have been entered into the medical record, reviewed, and no changes needed.   Review of Systems: No fevers, chills, night sweats, weight loss, chest pain, or shortness of breath.   Objective:    General: Well Developed, well nourished, and in no acute distress.  Neuro: Alert and oriented x3, extra-ocular muscles intact, sensation grossly intact.  HEENT: Normocephalic, atraumatic, pupils equal round reactive to light, neck supple, no masses, no lymphadenopathy, thyroid nonpalpable.  Skin: Warm and dry, no rashes. Cardiac: Regular rate and rhythm, no murmurs rubs or gallops, no lower extremity edema.  Respiratory: Clear to auscultation bilaterally. Not using accessory muscles, speaking in full sentences.  Impression and Recommendations:

## 2015-02-02 NOTE — Assessment & Plan Note (Signed)
Continues to do well after aspiration and injection of a knee joint hemarthrosis at the last visit, return as needed.

## 2015-02-10 DIAGNOSIS — H43393 Other vitreous opacities, bilateral: Secondary | ICD-10-CM | POA: Diagnosis not present

## 2015-02-10 DIAGNOSIS — H16211 Exposure keratoconjunctivitis, right eye: Secondary | ICD-10-CM | POA: Diagnosis not present

## 2015-02-10 DIAGNOSIS — H2513 Age-related nuclear cataract, bilateral: Secondary | ICD-10-CM | POA: Diagnosis not present

## 2015-04-12 DIAGNOSIS — E0590 Thyrotoxicosis, unspecified without thyrotoxic crisis or storm: Secondary | ICD-10-CM | POA: Diagnosis not present

## 2015-05-16 ENCOUNTER — Ambulatory Visit (INDEPENDENT_AMBULATORY_CARE_PROVIDER_SITE_OTHER): Payer: Medicare Other | Admitting: Sports Medicine

## 2015-05-16 ENCOUNTER — Encounter: Payer: Self-pay | Admitting: Sports Medicine

## 2015-05-16 VITALS — BP 155/88 | HR 54 | Wt 180.3 lb

## 2015-05-16 DIAGNOSIS — Z6825 Body mass index (BMI) 25.0-25.9, adult: Secondary | ICD-10-CM | POA: Diagnosis not present

## 2015-05-16 DIAGNOSIS — M170 Bilateral primary osteoarthritis of knee: Secondary | ICD-10-CM | POA: Insufficient documentation

## 2015-05-16 DIAGNOSIS — D519 Vitamin B12 deficiency anemia, unspecified: Secondary | ICD-10-CM | POA: Diagnosis not present

## 2015-05-16 DIAGNOSIS — R7302 Impaired glucose tolerance (oral): Secondary | ICD-10-CM | POA: Diagnosis not present

## 2015-05-16 DIAGNOSIS — I10 Essential (primary) hypertension: Secondary | ICD-10-CM | POA: Diagnosis not present

## 2015-05-16 DIAGNOSIS — Z79899 Other long term (current) drug therapy: Secondary | ICD-10-CM | POA: Diagnosis not present

## 2015-05-16 DIAGNOSIS — E0590 Thyrotoxicosis, unspecified without thyrotoxic crisis or storm: Secondary | ICD-10-CM | POA: Diagnosis not present

## 2015-05-16 DIAGNOSIS — E782 Mixed hyperlipidemia: Secondary | ICD-10-CM | POA: Diagnosis not present

## 2015-05-16 DIAGNOSIS — E049 Nontoxic goiter, unspecified: Secondary | ICD-10-CM | POA: Diagnosis not present

## 2015-05-16 DIAGNOSIS — I9589 Other hypotension: Secondary | ICD-10-CM | POA: Diagnosis not present

## 2015-05-16 NOTE — Assessment & Plan Note (Signed)
Left knee effusion, currently pain-free. Aspiration of the left knee, return as needed to start Visco supplementation.

## 2015-05-16 NOTE — Progress Notes (Signed)
  Subjective:    CC: Follow-up  HPI:  This is a pleasant 75 year old male, for some time he has had a few days of swelling of both knees, left worse than right, that has since resolved today.  He has no knee osteoarthritis, and bilateral quadriceps and femoral neuropathy with inability to extend his knees.  Past medical history, Surgical history, Family history not pertinant except as noted below, Social history, Allergies, and medications have been entered into the medical record, reviewed, and no changes needed.   Review of Systems: No fevers, chills, night sweats, weight loss, chest pain, or shortness of breath.   Objective:    General: Well Developed, well nourished, and in no acute distress.  Neuro: Alert and oriented x3, extra-ocular muscles intact, sensation grossly intact.  HEENT: Normocephalic, atraumatic, pupils equal round reactive to light, neck supple, no masses, no lymphadenopathy, thyroid nonpalpable.  Skin: Warm and dry, no rashes. Cardiac: Regular rate and rhythm, no murmurs rubs or gallops, no lower extremity edema.  Respiratory: Clear to auscultation bilaterally. Not using accessory muscles, speaking in full sentences. Left Knee: Visibly swollen with a palpable effusion and fluid wave ROM normal passively, unable to extend, in flexion and extension and lower leg rotation. Ligaments with solid consistent endpoints including ACL, PCL, LCL, MCL. Negative Mcmurray's and provocative meniscal tests. Non painful patellar compression. Patellar and quadriceps tendons unremarkable. Hamstring and quadriceps strength is normal.  Procedure: Real-time Ultrasound Guided aspiration of left knee Device: GE Logiq E  Verbal informed consent obtained.  Time-out conducted.  Noted no overlying erythema, induration, or other signs of local infection.  Skin prepped in a sterile fashion.  Local anesthesia: Topical Ethyl chloride.  With sterile technique and under real time ultrasound  guidance:  Aspirated 25 mL straw-colored fluid from the left suprapatellar recess Completed without difficulty  Pain immediately resolved suggesting accurate placement of the medication.  Advised to call if fevers/chills, erythema, induration, drainage, or persistent bleeding.  Images permanently stored and available for review in the ultrasound unit.  Impression: Technically successful ultrasound guided injection. Impression and Recommendations:

## 2015-05-17 ENCOUNTER — Ambulatory Visit: Payer: Medicare Other | Admitting: Sports Medicine

## 2015-06-08 DIAGNOSIS — R9431 Abnormal electrocardiogram [ECG] [EKG]: Secondary | ICD-10-CM | POA: Diagnosis not present

## 2015-06-08 DIAGNOSIS — E785 Hyperlipidemia, unspecified: Secondary | ICD-10-CM | POA: Diagnosis not present

## 2015-06-08 DIAGNOSIS — I440 Atrioventricular block, first degree: Secondary | ICD-10-CM | POA: Diagnosis not present

## 2015-06-08 DIAGNOSIS — Z955 Presence of coronary angioplasty implant and graft: Secondary | ICD-10-CM | POA: Diagnosis not present

## 2015-06-08 DIAGNOSIS — I739 Peripheral vascular disease, unspecified: Secondary | ICD-10-CM | POA: Diagnosis not present

## 2015-06-08 DIAGNOSIS — I4510 Unspecified right bundle-branch block: Secondary | ICD-10-CM | POA: Diagnosis not present

## 2015-06-08 DIAGNOSIS — R001 Bradycardia, unspecified: Secondary | ICD-10-CM | POA: Diagnosis not present

## 2015-06-08 DIAGNOSIS — I10 Essential (primary) hypertension: Secondary | ICD-10-CM | POA: Diagnosis not present

## 2015-06-08 DIAGNOSIS — I2510 Atherosclerotic heart disease of native coronary artery without angina pectoris: Secondary | ICD-10-CM | POA: Diagnosis not present

## 2015-07-05 DIAGNOSIS — Z85820 Personal history of malignant melanoma of skin: Secondary | ICD-10-CM | POA: Diagnosis not present

## 2015-07-05 DIAGNOSIS — Z08 Encounter for follow-up examination after completed treatment for malignant neoplasm: Secondary | ICD-10-CM | POA: Diagnosis not present

## 2015-07-05 DIAGNOSIS — L570 Actinic keratosis: Secondary | ICD-10-CM | POA: Diagnosis not present

## 2015-07-07 DIAGNOSIS — Z1211 Encounter for screening for malignant neoplasm of colon: Secondary | ICD-10-CM | POA: Diagnosis not present

## 2015-07-07 DIAGNOSIS — D120 Benign neoplasm of cecum: Secondary | ICD-10-CM | POA: Diagnosis not present

## 2015-07-07 DIAGNOSIS — D122 Benign neoplasm of ascending colon: Secondary | ICD-10-CM | POA: Diagnosis not present

## 2015-07-08 ENCOUNTER — Emergency Department (INDEPENDENT_AMBULATORY_CARE_PROVIDER_SITE_OTHER)
Admission: EM | Admit: 2015-07-08 | Discharge: 2015-07-08 | Disposition: A | Discharge status: Home / Self Care | Payer: Medicare Other | Source: Home / Self Care | Attending: Family Medicine | Admitting: Family Medicine

## 2015-07-08 ENCOUNTER — Encounter: Payer: Self-pay | Admitting: Emergency Medicine

## 2015-07-08 ENCOUNTER — Emergency Department (INDEPENDENT_AMBULATORY_CARE_PROVIDER_SITE_OTHER): Payer: Medicare Other

## 2015-07-08 DIAGNOSIS — S8001XA Contusion of right knee, initial encounter: Secondary | ICD-10-CM

## 2015-07-08 DIAGNOSIS — M25461 Effusion, right knee: Secondary | ICD-10-CM

## 2015-07-08 DIAGNOSIS — M25561 Pain in right knee: Secondary | ICD-10-CM | POA: Diagnosis not present
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
## 2015-07-08 NOTE — Discharge Instructions (Signed)
Apply ice pack for 20 to 30 minutes, 3 to 4 times daily  Continue until pain decreases.  Wear knee brace daily until improved.  May take Tylenol as needed for pain.  Begin straight leg raising exercises as tolerated.   Contusion A contusion is a deep bruise. Contusions are the result of a blunt injury to tissues and muscle fibers under the skin. The injury causes bleeding under the skin. The skin overlying the contusion may turn blue, purple, or yellow. Minor injuries will give you a painless contusion, but more severe contusions may stay painful and swollen for a few weeks.  CAUSES  This condition is usually caused by a blow, trauma, or direct force to an area of the body. SYMPTOMS  Symptoms of this condition include:  Swelling of the injured area.  Pain and tenderness in the injured area.  Discoloration. The area may have redness and then turn blue, purple, or yellow. DIAGNOSIS  This condition is diagnosed based on a physical exam and medical history. An X-ray, CT scan, or MRI may be needed to determine if there are any associated injuries, such as broken bones (fractures). TREATMENT  Specific treatment for this condition depends on what area of the body was injured. In general, the best treatment for a contusion is resting, icing, applying pressure to (compression), and elevating the injured area. This is often called the RICE strategy. Over-the-counter anti-inflammatory medicines may also be recommended for pain control.  HOME CARE INSTRUCTIONS   Rest the injured area.  If directed, apply ice to the injured area:  Put ice in a plastic bag.  Place a towel between your skin and the bag.  Leave the ice on for 20 minutes, 2-3 times per day.  If directed, apply light compression to the injured area using an elastic bandage. Make sure the bandage is not wrapped too tightly. Remove and reapply the bandage as directed by your health care provider.  If possible, raise (elevate) the  injured area above the level of your heart while you are sitting or lying down.  Take over-the-counter and prescription medicines only as told by your health care provider. SEEK MEDICAL CARE IF:  Your symptoms do not improve after several days of treatment.  Your symptoms get worse.  You have difficulty moving the injured area. SEEK IMMEDIATE MEDICAL CARE IF:   You have severe pain.  You have numbness in a hand or foot.  Your hand or foot turns pale or cold.   This information is not intended to replace advice given to you by your health care provider. Make sure you discuss any questions you have with your health care provider.   Document Released: 10/24/2004 Document Revised: 10/05/2014 Document Reviewed: 06/01/2014 Elsevier Interactive Patient Education [employer].

## 2015-07-20 DIAGNOSIS — Z0001 Encounter for general adult medical examination with abnormal findings: Secondary | ICD-10-CM | POA: Diagnosis not present

## 2015-07-20 DIAGNOSIS — Z136 Encounter for screening for cardiovascular disorders: Secondary | ICD-10-CM | POA: Diagnosis not present

## 2015-07-20 DIAGNOSIS — R7302 Impaired glucose tolerance (oral): Secondary | ICD-10-CM | POA: Diagnosis not present

## 2015-07-20 DIAGNOSIS — Z79899 Other long term (current) drug therapy: Secondary | ICD-10-CM | POA: Diagnosis not present

## 2015-07-20 DIAGNOSIS — E782 Mixed hyperlipidemia: Secondary | ICD-10-CM | POA: Diagnosis not present

## 2015-07-20 DIAGNOSIS — E049 Nontoxic goiter, unspecified: Secondary | ICD-10-CM | POA: Diagnosis not present

## 2015-07-20 DIAGNOSIS — E0590 Thyrotoxicosis, unspecified without thyrotoxic crisis or storm: Secondary | ICD-10-CM | POA: Diagnosis not present

## 2015-07-20 DIAGNOSIS — D519 Vitamin B12 deficiency anemia, unspecified: Secondary | ICD-10-CM | POA: Diagnosis not present

## 2015-07-27 DIAGNOSIS — Z0001 Encounter for general adult medical examination with abnormal findings: Secondary | ICD-10-CM | POA: Diagnosis not present

## 2015-07-27 DIAGNOSIS — I10 Essential (primary) hypertension: Secondary | ICD-10-CM | POA: Diagnosis not present

## 2015-09-29 ENCOUNTER — Ambulatory Visit (INDEPENDENT_AMBULATORY_CARE_PROVIDER_SITE_OTHER): Payer: Medicare Other | Admitting: Sports Medicine

## 2015-09-29 ENCOUNTER — Encounter: Payer: Self-pay | Admitting: Sports Medicine

## 2015-09-29 DIAGNOSIS — I8311 Varicose veins of right lower extremity with inflammation: Secondary | ICD-10-CM

## 2015-09-29 DIAGNOSIS — I872 Venous insufficiency (chronic) (peripheral): Secondary | ICD-10-CM | POA: Insufficient documentation

## 2015-09-29 DIAGNOSIS — I8312 Varicose veins of left lower extremity with inflammation: Secondary | ICD-10-CM

## 2015-09-29 NOTE — Patient Instructions (Signed)

## 2015-09-29 NOTE — Assessment & Plan Note (Signed)
Left worse than right, he will wear compression hose on the right side and an Unna boot will be placed on the left, he will return weekly for Unna boot changes for 4 weeks.

## 2015-09-29 NOTE — Progress Notes (Signed)
  Subjective:    CC: Leg swelling  HPI: This is a pleasant 75 year old male, has several months of increasing swelling and discoloration above the ankles, no pain.  Mostly pruritus.  Moderate and persistent.  Left worse than right.  No trauma, no constitutional symptoms.  Past medical history, Surgical history, Family history not pertinant except as noted below, Social history, Allergies, and medications have been entered into the medical record, reviewed, and no changes needed.   Review of Systems: No fevers, chills, night sweats, weight loss, chest pain, or shortness of breath.   Objective:    General: Well Developed, well nourished, and in no acute distress.  Neuro: Alert and oriented x3, extra-ocular muscles intact, sensation grossly intact.  HEENT: Normocephalic, atraumatic, pupils equal round reactive to light, neck supple, no masses, no lymphadenopathy, thyroid nonpalpable.  Skin: Warm and dry, no rashes. Cardiac: Regular rate and rhythm, no murmurs rubs or gallops, no lower extremity edema.  Respiratory: Clear to auscultation bilaterally. Not using accessory muscles, speaking in full sentences. Lower extremities:  Both legs are swollen with 3+ pitting edema on the left and 2+ on the right, hemosiderin deposits, no induration or pain.  DP and PT pulses both palpable and negative homans sign bilaterally.  Unna boot placed on the left.  Impression and Recommendations:    Venous stasis dermatitis of both lower extremities Left worse than right, he will wear compression hose on the right side and an Unna boot will be placed on the left, he will return weekly for Unna boot changes for 4 weeks.

## 2015-10-06 ENCOUNTER — Ambulatory Visit (INDEPENDENT_AMBULATORY_CARE_PROVIDER_SITE_OTHER): Payer: Medicare Other | Admitting: Sports Medicine

## 2015-10-06 ENCOUNTER — Ambulatory Visit: Payer: Medicare Other | Admitting: Sports Medicine

## 2015-10-06 VITALS — BP 130/70 | HR 50 | Wt 180.0 lb

## 2015-10-06 DIAGNOSIS — I8312 Varicose veins of left lower extremity with inflammation: Secondary | ICD-10-CM

## 2015-10-06 NOTE — Progress Notes (Signed)
   Subjective:    Patient ID: Russell Neal, male    DOB: Oct 05, 1940, 75 y.o.   MRN: [id number]  HPI  [name] is here for a dressing change. He has a Haematologist on left leg. No drainage or swelling of the wound site.   Review of Systems     Objective:   Physical Exam        Assessment & Plan:  Dressing change - changed dressing on left leg.

## 2015-10-10 DIAGNOSIS — E0590 Thyrotoxicosis, unspecified without thyrotoxic crisis or storm: Secondary | ICD-10-CM | POA: Diagnosis not present

## 2015-10-10 DIAGNOSIS — E010 Iodine-deficiency related diffuse (endemic) goiter: Secondary | ICD-10-CM | POA: Diagnosis not present

## 2015-10-10 DIAGNOSIS — I2510 Atherosclerotic heart disease of native coronary artery without angina pectoris: Secondary | ICD-10-CM | POA: Diagnosis not present

## 2015-10-13 ENCOUNTER — Ambulatory Visit (INDEPENDENT_AMBULATORY_CARE_PROVIDER_SITE_OTHER): Payer: Medicare Other | Admitting: Sports Medicine

## 2015-10-13 VITALS — BP 128/71 | HR 52 | Wt 180.0 lb

## 2015-10-13 DIAGNOSIS — I8312 Varicose veins of left lower extremity with inflammation: Secondary | ICD-10-CM

## 2015-10-13 DIAGNOSIS — Z23 Encounter for immunization: Secondary | ICD-10-CM | POA: Diagnosis not present

## 2015-10-13 DIAGNOSIS — I872 Venous insufficiency (chronic) (peripheral): Secondary | ICD-10-CM

## 2015-10-13 DIAGNOSIS — I8311 Varicose veins of right lower extremity with inflammation: Secondary | ICD-10-CM | POA: Diagnosis not present

## 2015-10-13 NOTE — Progress Notes (Addendum)
Pt is here to have his unna boot changed. Dressing was removed, area cleaned with Hibiclens, dried off. Dr. Dianah Field came in to look at area prior to new dressing being applied.  Pt also given hi dose flu vaccine. He tolerated well. Observed vaccination given by Noel Gerold PA-C student. Pt has an appt for 10/20/15 for dressing change.Russell Neal;

## 2015-10-16 ENCOUNTER — Encounter: Payer: Self-pay | Admitting: Sports Medicine

## 2015-10-16 ENCOUNTER — Ambulatory Visit (INDEPENDENT_AMBULATORY_CARE_PROVIDER_SITE_OTHER): Payer: Medicare Other | Admitting: Sports Medicine

## 2015-10-16 DIAGNOSIS — I8312 Varicose veins of left lower extremity with inflammation: Secondary | ICD-10-CM

## 2015-10-16 DIAGNOSIS — I8311 Varicose veins of right lower extremity with inflammation: Secondary | ICD-10-CM | POA: Diagnosis not present

## 2015-10-16 DIAGNOSIS — L03031 Cellulitis of right toe: Secondary | ICD-10-CM | POA: Diagnosis not present

## 2015-10-16 DIAGNOSIS — B359 Dermatophytosis, unspecified: Secondary | ICD-10-CM | POA: Diagnosis not present

## 2015-10-16 DIAGNOSIS — R4182 Altered mental status, unspecified: Secondary | ICD-10-CM

## 2015-10-16 DIAGNOSIS — I872 Venous insufficiency (chronic) (peripheral): Secondary | ICD-10-CM

## 2015-10-16 DIAGNOSIS — Z6824 Body mass index (BMI) 24.0-24.9, adult: Secondary | ICD-10-CM | POA: Diagnosis not present

## 2015-10-16 DIAGNOSIS — I10 Essential (primary) hypertension: Secondary | ICD-10-CM | POA: Diagnosis not present

## 2015-10-16 NOTE — Assessment & Plan Note (Signed)
Looks good. Discontinue Unna boots, strapped in the left lower extremity with an Ace bandage. Follow-up PCP for altered mental status.
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
## 2015-10-20 ENCOUNTER — Ambulatory Visit (INDEPENDENT_AMBULATORY_CARE_PROVIDER_SITE_OTHER): Payer: Medicare Other | Admitting: Sports Medicine

## 2015-10-20 DIAGNOSIS — I8311 Varicose veins of right lower extremity with inflammation: Secondary | ICD-10-CM

## 2015-10-20 DIAGNOSIS — I8312 Varicose veins of left lower extremity with inflammation: Secondary | ICD-10-CM | POA: Diagnosis not present

## 2015-10-20 DIAGNOSIS — I872 Venous insufficiency (chronic) (peripheral): Secondary | ICD-10-CM

## 2015-10-20 MED ORDER — AMBULATORY NON FORMULARY MEDICATION: 0 refills | Status: DC

## 2015-10-20 NOTE — Progress Notes (Signed)
Patient came into clinic today for follow up regarding bilateral lower extremity lymphedema. Pt was seen in office earlier this week and unna boots were discontinued. Pt presents today in office wearing bilateral TED hose. Pt states he wears them during the day, but removes them at night. Pt does have some bilateral pitting edema, no open wounds. Treating physician was able to come in and evaluate Pt in office today. Pt was advised to get tighter TED hose and to wear them at all times except for hygiene purposes. Pt verbalized understanding and new Rx was given for TED hose. Pt already has an appt scheduled for next week, advised to keep that. No further questions/concerns.

## 2015-10-27 ENCOUNTER — Ambulatory Visit (INDEPENDENT_AMBULATORY_CARE_PROVIDER_SITE_OTHER): Payer: Medicare Other | Admitting: Sports Medicine

## 2015-10-27 DIAGNOSIS — I8311 Varicose veins of right lower extremity with inflammation: Secondary | ICD-10-CM | POA: Diagnosis not present

## 2015-10-27 DIAGNOSIS — I872 Venous insufficiency (chronic) (peripheral): Secondary | ICD-10-CM

## 2015-10-27 DIAGNOSIS — I8312 Varicose veins of left lower extremity with inflammation: Secondary | ICD-10-CM | POA: Diagnosis not present

## 2015-10-27 NOTE — Assessment & Plan Note (Signed)
Discontinue Unna boots, strapped both legs with compressive dressing, he can simply continue Ace bandage wraps.

## 2015-10-27 NOTE — Progress Notes (Signed)
  Subjective:    CC: Follow-up  HPI: Venous stasis dermatitis: Overall doing much better after multiple Unna boots, at this point he desires to simply use Ace wraps rather than his compression hose.  Past medical history:  Negative.  See flowsheet/record as well for more information.  Surgical history: Negative.  See flowsheet/record as well for more information.  Family history: Negative.  See flowsheet/record as well for more information.  Social history: Negative.  See flowsheet/record as well for more information.  Allergies, and medications have been entered into the medical record, reviewed, and no changes needed.   Review of Systems: No fevers, chills, night sweats, weight loss, chest pain, or shortness of breath.   Objective:    General: Well Developed, well nourished, and in no acute distress.  Neuro: Alert and oriented x3, extra-ocular muscles intact, sensation grossly intact.  HEENT: Normocephalic, atraumatic, pupils equal round reactive to light, neck supple, no masses, no lymphadenopathy, thyroid nonpalpable.  Skin: Warm and dry, no rashes. Cardiac: Regular rate and rhythm, no murmurs rubs or gallops, 2+ lower extremity edema on the left, 1+ on the right, bilateral lower extremity hemosiderin deposits, no venous stasis ulcer or signs of bacterial infection. Respiratory: Clear to auscultation bilaterally. Not using accessory muscles, speaking in full sentences.  Both ankles were strapped with compressive dressing  Impression and Recommendations:    Venous stasis dermatitis of both lower extremities Discontinue Unna boots, strapped both legs with compressive dressing, he can simply continue Ace bandage wraps.

## 2015-11-03 ENCOUNTER — Encounter: Payer: Self-pay | Admitting: *Deleted

## 2015-11-03 ENCOUNTER — Emergency Department (INDEPENDENT_AMBULATORY_CARE_PROVIDER_SITE_OTHER): Payer: Medicare Other

## 2015-11-03 ENCOUNTER — Emergency Department (INDEPENDENT_AMBULATORY_CARE_PROVIDER_SITE_OTHER)
Admission: EM | Admit: 2015-11-03 | Discharge: 2015-11-03 | Disposition: A | Discharge status: Home / Self Care | Payer: Medicare Other | Source: Home / Self Care | Attending: Family Medicine | Admitting: Family Medicine

## 2015-11-03 ENCOUNTER — Telehealth: Payer: Self-pay

## 2015-11-03 DIAGNOSIS — G3189 Other specified degenerative diseases of nervous system: Secondary | ICD-10-CM

## 2015-11-03 DIAGNOSIS — W010XXA Fall on same level from slipping, tripping and stumbling without subsequent striking against object, initial encounter: Secondary | ICD-10-CM

## 2015-11-03 DIAGNOSIS — G9389 Other specified disorders of brain: Secondary | ICD-10-CM

## 2015-11-03 DIAGNOSIS — R220 Localized swelling, mass and lump, head: Secondary | ICD-10-CM | POA: Diagnosis not present

## 2015-11-03 DIAGNOSIS — S0990XA Unspecified injury of head, initial encounter: Secondary | ICD-10-CM

## 2015-11-03 DIAGNOSIS — S0083XA Contusion of other part of head, initial encounter: Secondary | ICD-10-CM

## 2015-11-03 DIAGNOSIS — S51012A Laceration without foreign body of left elbow, initial encounter: Secondary | ICD-10-CM

## 2015-11-03 NOTE — Telephone Encounter (Signed)
Pt is feeling great, he will follow up as needed.

## 2015-11-03 NOTE — ED Triage Notes (Signed)
Patient reports falling in his driveway this morning hitting his left side on the concrete. Laceration and swelling present above left eye, abrasion to left elbow. He is on the anticoagulant Brilinta. Denies loss of consciousness. Bleeding has stopped. He reports some confusion but this is not new for him. Sites cleaned with Hibiclens.

## 2015-11-03 NOTE — ED Provider Notes (Signed)
CSN: [id number]     Arrival date & time 11/03/15  [id number] History   First MD Initiated Contact with Patient 11/03/15 [phone]     Chief Complaint  Patient presents with  . Fall  . Laceration   (Consider location/radiation/quality/duration/timing/severity/associated sxs/prior Treatment) HPI  Russell Neal is a 75 y.o. male presenting to UC with wife with reports of trip and fall in his driveway around [id number] this morning.  Pt presenting with a laceration and hematoma to his Left eyebrow and a skin tear of his Left elbow.  Denies LOC.  Pain is aching and sore on his head. No other injuries.  He is on a antiplatelet medication- Brilinta for hx of CAD.  He has been on the medication for at least 5 years.  Denies headache, change in vision or nausea. He reports he can be a little forgetful at times but notes that is normal for him. He also reports having a brain tumor removed about 30 years ago.  Wife notes he has had a little trouble with balance but that too is normal for pt.    Past Medical History:  Diagnosis Date  . Hyperlipidemia   . Hypertension    History reviewed. No pertinent surgical history. History reviewed. No pertinent family history. Social History  Substance Use Topics  . Smoking status: Former Smoker    Types: Cigarettes    Quit date: 02/18/1973  . Smokeless tobacco: Never Used  . Alcohol use Yes    Review of Systems  HENT: Positive for facial swelling (Left eyebrow). Negative for dental problem and nosebleeds.   Eyes: Negative for pain and visual disturbance.  Cardiovascular: Negative for chest pain, palpitations and leg swelling.  Gastrointestinal: Negative for nausea and vomiting.  Musculoskeletal: Positive for gait problem ( chronic slight issues with balance). Negative for arthralgias, back pain, joint swelling and myalgias.  Skin: Positive for wound. Negative for rash.  Neurological: Negative for dizziness, seizures, syncope, weakness, light-headedness and headaches.     Allergies  Review of patient's allergies indicates no known allergies.  Home Medications   Prior to Admission medications   Medication Sig Start Date End Date Taking? Authorizing Provider  AMBULATORY NON FORMULARY MEDICATION Knee-high, FIRM compression, graduated compression stockings. Apply to lower extremities. 30-40 pressure 10/20/15   Silverio Decamp, MD  aspirin 81 MG tablet Take 81 mg by mouth daily.    Historical Provider, MD  atenolol (TENORMIN) 50 MG tablet Take 50 mg by mouth daily.    Historical Provider, MD  atorvastatin (LIPITOR) 80 MG tablet Take 80 mg by mouth daily.    Historical Provider, MD  hydrochlorothiazide (MICROZIDE) 12.5 MG capsule Take 12.5 mg by mouth daily.    Historical Provider, MD  methimazole (TAPAZOLE) 5 MG tablet TAKE ONE-HALF TABLET BY MOUTH ONCE DAILY 10/11/15   Historical Provider, MD  sertraline (ZOLOFT) 25 MG tablet Take 25 mg by mouth daily.    Historical Provider, MD  Ticagrelor (BRILINTA) 90 MG TABS tablet Take by mouth 2 (two) times daily.    Historical Provider, MD   Meds Ordered and Administered this Visit  Medications - No data to display  BP 165/89 (BP Location: Left Arm)   Pulse (!) 53   SpO2 98%  No data found.   Physical Exam  Constitutional: He is oriented to person, place, and time. He appears well-developed and well-nourished. No distress.  HENT:  Head: Normocephalic. Head is with contusion and with laceration ( skin tear).    Right  Ear: Tympanic membrane normal.  Left Ear: Tympanic membrane normal.  Nose: Nose normal. No nose lacerations, sinus tenderness or nasal deformity. No epistaxis.  Mouth/Throat: Uvula is midline, oropharynx is clear and moist and mucous membranes are normal. No trismus in the jaw.  Left upper eyebrow- hematoma, superficial skin tear. Bleeding controlled.   Eyes: Conjunctivae and EOM are normal. Pupils are equal, round, and reactive to light. Right eye exhibits no discharge. Left eye exhibits  no discharge.  Neck: Normal range of motion.  Cardiovascular: Normal rate and regular rhythm.   Pulmonary/Chest: Effort normal.  Musculoskeletal: Normal range of motion.  Neurological: He is alert and oriented to person, place, and time. He has normal strength. No cranial nerve deficit or sensory deficit. He displays a negative Romberg sign. Coordination and gait normal. GCS eye subscore is 4. GCS verbal subscore is 5. GCS motor subscore is 6.  Skin: Skin is warm and dry. He is not diaphoretic.  3cm superficial skin tear to Left elbow. No foreign bodies seen or palpated.  Psychiatric: He has a normal mood and affect. His behavior is normal.  Nursing note and vitals reviewed.   Urgent Care Course   Clinical Course    Procedures (including critical care time)  Labs Review Labs Reviewed - No data to display  Imaging Review Ct Head Wo Contrast  Result Date: 11/03/2015 CLINICAL DATA:  Trauma today. Laceration and bruising to left forehead. Patient on blood thinners. EXAM: CT HEAD WITHOUT CONTRAST TECHNIQUE: Contiguous axial images were obtained from the base of the skull through the vertex without intravenous contrast. COMPARISON:  None. FINDINGS: Brain: Cerebral atrophy, resulting in prominence of the extra-axial spaces adjacent the frontal lobes. There is also advanced cerebellar atrophy with encephalomalacia in the right cerebellar hemisphere. No mass lesion, hemorrhage, hydrocephalus, acute infarct, intra-axial, or extra-axial fluid collection. Vascular: Bilateral carotid intracranial atherosclerosis. Skull: Left periorbital and supraorbital scalp soft tissue swelling is moderate. No skull fracture. Right suboccipital craniotomy. Sinuses/Orbits: Normal imaged orbits and globes. Minimal fluid in the left maxillary sinus. Other paranasal sinuses and mastoid air cells clear. Cerumen in the right external ear canal. Other: None IMPRESSION: 1. Left periorbital and supraorbital scalp soft tissue  swelling, without acute intracranial abnormality. 2. Cerebral and cerebellar atrophy with encephalomalacia in the right cerebellar hemisphere. Electronically Signed   By: Abigail Miyamoto M.D.   On: 11/03/2015 10:23    MDM   1. Injury of head, initial encounter   2. Facial hematoma, initial encounter   3. Head injury due to trauma   4. Fall from slip, trip, or stumble, initial encounter   5. Skin tear of left elbow without complication, initial encounter    Pt presenting to UC after trip and fall about 1 hour PTA at home. Hematoma to Left eyebrow and skin tear to Left elbow. Pt is on antiplatelet medication- Brilinta.  CT head: scalp soft tissue swelling w/o acute intracranial abnormality. Discussed imaging with pt and his wife.  Pt is safe for discharge home, however, encouraged wife to monitor pt today. If symptoms worsen- severe headache, change in vision, nausea or vomiting, change in behavior, or other new concerning symptoms develop- advised to call 911 or go to closest emergency department.   Patient and wife verbalized understanding and agreement with treatment plan.      Noland Fordyce, PA-C 11/03/15 1046

## 2015-11-16 ENCOUNTER — Ambulatory Visit (INDEPENDENT_AMBULATORY_CARE_PROVIDER_SITE_OTHER): Payer: Medicare Other | Admitting: Sports Medicine

## 2015-11-16 ENCOUNTER — Ambulatory Visit (INDEPENDENT_AMBULATORY_CARE_PROVIDER_SITE_OTHER): Payer: Medicare Other

## 2015-11-16 DIAGNOSIS — S82002A Unspecified fracture of left patella, initial encounter for closed fracture: Secondary | ICD-10-CM

## 2015-11-16 DIAGNOSIS — M170 Bilateral primary osteoarthritis of knee: Secondary | ICD-10-CM | POA: Diagnosis not present

## 2015-11-16 DIAGNOSIS — X58XXXA Exposure to other specified factors, initial encounter: Secondary | ICD-10-CM | POA: Diagnosis not present

## 2015-11-16 DIAGNOSIS — S82092A Other fracture of left patella, initial encounter for closed fracture: Secondary | ICD-10-CM | POA: Diagnosis not present

## 2015-11-16 NOTE — Progress Notes (Signed)
  Subjective:    CC: Left knee pain and swelling  HPI: This is a pleasant 75 year old male, he had a mild fall, and subsequent swelling of his left knee. Our last injection and drainage was some time ago. He has recurrence of pain, moderate, persistent, localized at the joint lines and under the patella.  Past medical history:  Negative.  See flowsheet/record as well for more information.  Surgical history: Negative.  See flowsheet/record as well for more information.  Family history: Negative.  See flowsheet/record as well for more information.  Social history: Negative.  See flowsheet/record as well for more information.  Allergies, and medications have been entered into the medical record, reviewed, and no changes needed.   Review of Systems: No fevers, chills, night sweats, weight loss, chest pain, or shortness of breath.   Objective:    General: Well Developed, well nourished, and in no acute distress.  Neuro: Alert and oriented x3, extra-ocular muscles intact, sensation grossly intact.  HEENT: Normocephalic, atraumatic, pupils equal round reactive to light, neck supple, no masses, no lymphadenopathy, thyroid nonpalpable.  Skin: Warm and dry, no rashes. Cardiac: Regular rate and rhythm, no murmurs rubs or gallops, no lower extremity edema.  Respiratory: Clear to auscultation bilaterally. Not using accessory muscles, speaking in full sentences. Left Knee:  Visibly swollen with a palpable fluid wave, tender at the medial joint line and under the patella.  Procedure: Real-time Ultrasound Guided aspiration/injection of right knee Device: GE Logiq E  Verbal informed consent obtained.  Time-out conducted.  Noted no overlying erythema, induration, or other signs of local infection.  Skin prepped in a sterile fashion.  Local anesthesia: Topical Ethyl chloride.  With sterile technique and under real time ultrasound guidance:  Aspirated 35 mL of frank blood, syringe switched and 1 mL  kenalog 40, 2 mL lidocaine, 2 mL Marcaine injected easily. Completed without difficulty  Pain immediately resolved suggesting accurate placement of the medication.  Advised to call if fevers/chills, erythema, induration, drainage, or persistent bleeding.  Images permanently stored and available for review in the ultrasound unit.  Impression: Technically successful ultrasound guided injection.  Impression and Recommendations:    Primary osteoarthritis of both knees Aspiration and injection as above. X-rays. Strapped with compressive dressing Return as needed.

## 2015-11-16 NOTE — Assessment & Plan Note (Addendum)
Aspiration and injection as above. X-rays. Strapped with compressive dressing Return as needed.  X-rays do show a displaced patellar fracture, I question the age of this fracture. He does need to return tomorrow for a knee immobilizer.

## 2015-11-17 ENCOUNTER — Ambulatory Visit (INDEPENDENT_AMBULATORY_CARE_PROVIDER_SITE_OTHER): Payer: Medicare Other | Admitting: Sports Medicine

## 2015-11-17 DIAGNOSIS — M170 Bilateral primary osteoarthritis of knee: Secondary | ICD-10-CM | POA: Diagnosis not present

## 2015-11-17 MED ORDER — ROLLER WALKER MISC: 0 refills | Status: DC

## 2015-11-17 NOTE — Progress Notes (Signed)
  Subjective:    CC: Follow-up  HPI: This is a pleasant 75 year old male, we saw him yesterday for a knee injury, he has 0/5 knee extension baseline due to peripheral neuropathy, he recently had a fall and had immediate pain, swelling over his knee. We aspirated frank blood from his knee and injected medication, he returns today pain-free, however his x-rays did show a transverse patellar fracture with minimal displacement. Because he has 0/5 strength in his extensor apparatus from neuropathy he is unlikely a candidate for operative fixation of the patellar fracture. He is here to be placed into a knee immobilizer. He is pain-free.  Past medical history:  Negative.  See flowsheet/record as well for more information.  Surgical history: Negative.  See flowsheet/record as well for more information.  Family history: Negative.  See flowsheet/record as well for more information.  Social history: Negative.  See flowsheet/record as well for more information.  Allergies, and medications have been entered into the medical record, reviewed, and no changes needed.   Review of Systems: No fevers, chills, night sweats, weight loss, chest pain, or shortness of breath.   Objective:    General: Well Developed, well nourished, and in no acute distress.  Neuro: Alert and oriented x3, extra-ocular muscles intact, sensation grossly intact.  HEENT: Normocephalic, atraumatic, pupils equal round reactive to light, neck supple, no masses, no lymphadenopathy, thyroid nonpalpable.  Skin: Warm and dry, no rashes. Cardiac: Regular rate and rhythm, no murmurs rubs or gallops, no lower extremity edema.  Respiratory: Clear to auscultation bilaterally. Not using accessory muscles, speaking in full sentences.  Knee immobilizer placed.  Impression and Recommendations:    Primary osteoarthritis of both knees After aspiration of hemarthrosis and injection he returns pain-free. X-rays did show a mid patellar fracture, age  is not really certain. I suspect it is old considering his lack of pain. He does need a 4 point walker. He will get the walker and then return to his knee immobilizer.  I spent 25 minutes with this patient, greater than 50% was face-to-face time counseling regarding the above diagnoses

## 2015-11-17 NOTE — Assessment & Plan Note (Signed)
After aspiration of hemarthrosis and injection he returns pain-free. X-rays did show a mid patellar fracture, age is not really certain. I suspect it is old considering his lack of pain. He does need a 4 point walker. He will get the walker and then return to his knee immobilizer.

## 2015-12-01 ENCOUNTER — Ambulatory Visit (INDEPENDENT_AMBULATORY_CARE_PROVIDER_SITE_OTHER): Payer: Medicare Other | Admitting: Sports Medicine

## 2015-12-01 ENCOUNTER — Encounter: Payer: Self-pay | Admitting: Sports Medicine

## 2015-12-01 DIAGNOSIS — M170 Bilateral primary osteoarthritis of knee: Secondary | ICD-10-CM | POA: Diagnosis not present

## 2015-12-01 NOTE — Assessment & Plan Note (Addendum)
Completed pain-free, symptoms resolved. I cannot palpate the fracture which I think was simply artifact on the x-ray. Discontinue leg splint, and return as needed.  We are going to try and get him a locking brace locked at 0 of extension to approximately 60 of flexion.

## 2015-12-01 NOTE — Progress Notes (Signed)
  Subjective:    CC: Follow-up  HPI: This is a pleasant 75 year old male, we aspirated a hemorrhagic arthrosis, x-ray showed possibility of a mid patellar fracture. He returns today after 2 weeks in a knee extension splint, completely pain-free with swelling completely resolved.  His venous stasis dermatitis is doing well with compression hose, asymptomatic.  Past medical history:  Negative.  See flowsheet/record as well for more information.  Surgical history: Negative.  See flowsheet/record as well for more information.  Family history: Negative.  See flowsheet/record as well for more information.  Social history: Negative.  See flowsheet/record as well for more information.  Allergies, and medications have been entered into the medical record, reviewed, and no changes needed.   Review of Systems: No fevers, chills, night sweats, weight loss, chest pain, or shortness of breath.   Objective:    General: Well Developed, well nourished, and in no acute distress.  Neuro: Alert and oriented x3, extra-ocular muscles intact, sensation grossly intact.  HEENT: Normocephalic, atraumatic, pupils equal round reactive to light, neck supple, no masses, no lymphadenopathy, thyroid nonpalpable.  Skin: Warm and dry, no rashes. Cardiac: Regular rate and rhythm, no murmurs rubs or gallops, no lower extremity edema.  Respiratory: Clear to auscultation bilaterally. Not using accessory muscles, speaking in full sentences. Left knee: Swelling is resolved, no palpable fractures, no palpable pain. Baseline 0/5 extension strength and good passive range of motion.  Impression and Recommendations:    Primary osteoarthritis of both knees Completed pain-free, symptoms resolved. I cannot palpate the fracture which I think was simply artifact on the x-ray. Discontinue leg splint, and return as needed.

## 2015-12-12 ENCOUNTER — Ambulatory Visit (INDEPENDENT_AMBULATORY_CARE_PROVIDER_SITE_OTHER): Payer: Medicare Other | Admitting: Sports Medicine

## 2015-12-12 DIAGNOSIS — M6281 Muscle weakness (generalized): Secondary | ICD-10-CM | POA: Diagnosis not present

## 2015-12-12 NOTE — Assessment & Plan Note (Signed)
Locking knee brace applied.

## 2015-12-12 NOTE — Progress Notes (Signed)
   Patient came into clinic today for fitting of a locking knee brace. Pt tolerated application of brace well. Physician came into room to make sure locking and extension degrees were appropriate. Went over in detail how to apply/remove brace. Pt verbalized understanding. Does have some concern on if his wife will have the strength to apply/remove. Advised to contact our office with any questions.   Dr. Dianah Field spent 25 minutes with this patient, greater than 50% was face-to-face time counseling regarding the above diagnoses

## 2015-12-14 ENCOUNTER — Ambulatory Visit (INDEPENDENT_AMBULATORY_CARE_PROVIDER_SITE_OTHER): Payer: Medicare Other | Admitting: Sports Medicine

## 2015-12-14 ENCOUNTER — Encounter: Payer: Self-pay | Admitting: Sports Medicine

## 2015-12-14 DIAGNOSIS — M6281 Muscle weakness (generalized): Secondary | ICD-10-CM | POA: Diagnosis not present

## 2015-12-14 NOTE — Progress Notes (Signed)
  Subjective:    CC: Follow-up  HPI: This is a pleasant 75 year old male with quadriceps atrophy on the left and 0 extension strength. He does occasionally have hyperextension of the left knee, so we tried to fit a locking knee brace recently. Unfortunately this did not end up comfortable and he is back for further evaluation.  Past medical history:  Negative.  See flowsheet/record as well for more information.  Surgical history: Negative.  See flowsheet/record as well for more information.  Family history: Negative.  See flowsheet/record as well for more information.  Social history: Negative.  See flowsheet/record as well for more information.  Allergies, and medications have been entered into the medical record, reviewed, and no changes needed.   Review of Systems: No fevers, chills, night sweats, weight loss, chest pain, or shortness of breath.   Objective:    General: Well Developed, well nourished, and in no acute distress.  Neuro: Alert and oriented x3, extra-ocular muscles intact, sensation grossly intact.  HEENT: Normocephalic, atraumatic, pupils equal round reactive to light, neck supple, no masses, no lymphadenopathy, thyroid nonpalpable.  Skin: Warm and dry, no rashes. Cardiac: Regular rate and rhythm, no murmurs rubs or gallops, no lower extremity edema.  Respiratory: Clear to auscultation bilaterally. Not using accessory muscles, speaking in full sentences.  TROM Cool placed and locked out at 30 of extension  Impression and Recommendations:    Quadriceps weakness New brace given.  I spent 25 minutes with this patient, greater than 50% was face-to-face time counseling regarding the above diagnoses

## 2015-12-14 NOTE — Assessment & Plan Note (Signed)
New brace given.

## 2016-01-12 DIAGNOSIS — N529 Male erectile dysfunction, unspecified: Secondary | ICD-10-CM | POA: Diagnosis present

## 2016-01-12 DIAGNOSIS — Z87891 Personal history of nicotine dependence: Secondary | ICD-10-CM | POA: Diagnosis not present

## 2016-01-12 DIAGNOSIS — M5030 Other cervical disc degeneration, unspecified cervical region: Secondary | ICD-10-CM | POA: Diagnosis not present

## 2016-01-12 DIAGNOSIS — S02118A Other fracture of occiput, initial encounter for closed fracture: Secondary | ICD-10-CM | POA: Diagnosis not present

## 2016-01-12 DIAGNOSIS — S069X0A Unspecified intracranial injury without loss of consciousness, initial encounter: Secondary | ICD-10-CM | POA: Diagnosis not present

## 2016-01-12 DIAGNOSIS — S098XXA Other specified injuries of head, initial encounter: Secondary | ICD-10-CM | POA: Diagnosis not present

## 2016-01-12 DIAGNOSIS — I517 Cardiomegaly: Secondary | ICD-10-CM | POA: Diagnosis not present

## 2016-01-12 DIAGNOSIS — G629 Polyneuropathy, unspecified: Secondary | ICD-10-CM | POA: Diagnosis not present

## 2016-01-12 DIAGNOSIS — S065X1A Traumatic subdural hemorrhage with loss of consciousness of 30 minutes or less, initial encounter: Secondary | ICD-10-CM | POA: Diagnosis not present

## 2016-01-12 DIAGNOSIS — G9389 Other specified disorders of brain: Secondary | ICD-10-CM | POA: Diagnosis not present

## 2016-01-12 DIAGNOSIS — I340 Nonrheumatic mitral (valve) insufficiency: Secondary | ICD-10-CM | POA: Diagnosis present

## 2016-01-12 DIAGNOSIS — K219 Gastro-esophageal reflux disease without esophagitis: Secondary | ICD-10-CM | POA: Diagnosis present

## 2016-01-12 DIAGNOSIS — G936 Cerebral edema: Secondary | ICD-10-CM | POA: Diagnosis not present

## 2016-01-12 DIAGNOSIS — S02119A Unspecified fracture of occiput, initial encounter for closed fracture: Secondary | ICD-10-CM | POA: Diagnosis not present

## 2016-01-12 DIAGNOSIS — S06309A Unspecified focal traumatic brain injury with loss of consciousness of unspecified duration, initial encounter: Secondary | ICD-10-CM | POA: Diagnosis not present

## 2016-01-12 DIAGNOSIS — I615 Nontraumatic intracerebral hemorrhage, intraventricular: Secondary | ICD-10-CM | POA: Diagnosis not present

## 2016-01-12 DIAGNOSIS — Z955 Presence of coronary angioplasty implant and graft: Secondary | ICD-10-CM | POA: Diagnosis not present

## 2016-01-12 DIAGNOSIS — D696 Thrombocytopenia, unspecified: Secondary | ICD-10-CM | POA: Diagnosis not present

## 2016-01-12 DIAGNOSIS — S0990XA Unspecified injury of head, initial encounter: Secondary | ICD-10-CM | POA: Diagnosis not present

## 2016-01-12 DIAGNOSIS — I10 Essential (primary) hypertension: Secondary | ICD-10-CM | POA: Diagnosis present

## 2016-01-12 DIAGNOSIS — I609 Nontraumatic subarachnoid hemorrhage, unspecified: Secondary | ICD-10-CM | POA: Diagnosis not present

## 2016-01-12 DIAGNOSIS — S0281XA Fracture of other specified skull and facial bones, right side, initial encounter for closed fracture: Secondary | ICD-10-CM | POA: Diagnosis present

## 2016-01-12 DIAGNOSIS — S06360A Traumatic hemorrhage of cerebrum, unspecified, without loss of consciousness, initial encounter: Secondary | ICD-10-CM | POA: Diagnosis not present

## 2016-01-12 DIAGNOSIS — I739 Peripheral vascular disease, unspecified: Secondary | ICD-10-CM | POA: Diagnosis present

## 2016-01-12 DIAGNOSIS — S065X0A Traumatic subdural hemorrhage without loss of consciousness, initial encounter: Secondary | ICD-10-CM | POA: Diagnosis not present

## 2016-01-12 DIAGNOSIS — G4489 Other headache syndrome: Secondary | ICD-10-CM | POA: Diagnosis not present

## 2016-01-12 DIAGNOSIS — E871 Hypo-osmolality and hyponatremia: Secondary | ICD-10-CM | POA: Diagnosis not present

## 2016-01-12 DIAGNOSIS — E785 Hyperlipidemia, unspecified: Secondary | ICD-10-CM | POA: Diagnosis present

## 2016-01-12 DIAGNOSIS — D649 Anemia, unspecified: Secondary | ICD-10-CM | POA: Diagnosis present

## 2016-01-12 DIAGNOSIS — S069X0D Unspecified intracranial injury without loss of consciousness, subsequent encounter: Secondary | ICD-10-CM | POA: Diagnosis not present

## 2016-01-12 DIAGNOSIS — W01198A Fall on same level from slipping, tripping and stumbling with subsequent striking against other object, initial encounter: Secondary | ICD-10-CM | POA: Diagnosis not present

## 2016-01-12 DIAGNOSIS — I2510 Atherosclerotic heart disease of native coronary artery without angina pectoris: Secondary | ICD-10-CM | POA: Diagnosis not present

## 2016-01-16 DIAGNOSIS — Z79899 Other long term (current) drug therapy: Secondary | ICD-10-CM | POA: Diagnosis not present

## 2016-01-16 DIAGNOSIS — I609 Nontraumatic subarachnoid hemorrhage, unspecified: Secondary | ICD-10-CM | POA: Diagnosis not present

## 2016-01-16 DIAGNOSIS — E785 Hyperlipidemia, unspecified: Secondary | ICD-10-CM | POA: Diagnosis not present

## 2016-01-16 DIAGNOSIS — I2510 Atherosclerotic heart disease of native coronary artery without angina pectoris: Secondary | ICD-10-CM | POA: Diagnosis not present

## 2016-01-16 DIAGNOSIS — I6202 Nontraumatic subacute subdural hemorrhage: Secondary | ICD-10-CM | POA: Diagnosis not present

## 2016-01-16 DIAGNOSIS — I70219 Atherosclerosis of native arteries of extremities with intermittent claudication, unspecified extremity: Secondary | ICD-10-CM | POA: Diagnosis not present

## 2016-01-16 DIAGNOSIS — S069X0A Unspecified intracranial injury without loss of consciousness, initial encounter: Secondary | ICD-10-CM | POA: Diagnosis not present

## 2016-01-16 DIAGNOSIS — I10 Essential (primary) hypertension: Secondary | ICD-10-CM | POA: Diagnosis not present

## 2016-01-16 DIAGNOSIS — E0590 Thyrotoxicosis, unspecified without thyrotoxic crisis or storm: Secondary | ICD-10-CM | POA: Diagnosis not present

## 2016-01-16 DIAGNOSIS — R2689 Other abnormalities of gait and mobility: Secondary | ICD-10-CM | POA: Diagnosis not present

## 2016-01-16 DIAGNOSIS — Z7902 Long term (current) use of antithrombotics/antiplatelets: Secondary | ICD-10-CM | POA: Diagnosis not present

## 2016-01-16 DIAGNOSIS — I739 Peripheral vascular disease, unspecified: Secondary | ICD-10-CM | POA: Diagnosis not present

## 2016-01-16 DIAGNOSIS — N529 Male erectile dysfunction, unspecified: Secondary | ICD-10-CM | POA: Diagnosis not present

## 2016-01-16 DIAGNOSIS — Z7982 Long term (current) use of aspirin: Secondary | ICD-10-CM | POA: Diagnosis not present

## 2016-01-16 DIAGNOSIS — R197 Diarrhea, unspecified: Secondary | ICD-10-CM | POA: Diagnosis not present

## 2016-01-16 DIAGNOSIS — S065X0D Traumatic subdural hemorrhage without loss of consciousness, subsequent encounter: Secondary | ICD-10-CM | POA: Diagnosis not present

## 2016-01-16 DIAGNOSIS — G629 Polyneuropathy, unspecified: Secondary | ICD-10-CM | POA: Diagnosis not present

## 2016-01-16 DIAGNOSIS — S065X0A Traumatic subdural hemorrhage without loss of consciousness, initial encounter: Secondary | ICD-10-CM | POA: Diagnosis not present

## 2016-01-16 DIAGNOSIS — I340 Nonrheumatic mitral (valve) insufficiency: Secondary | ICD-10-CM | POA: Diagnosis not present

## 2016-01-16 DIAGNOSIS — G936 Cerebral edema: Secondary | ICD-10-CM | POA: Diagnosis not present

## 2016-01-16 DIAGNOSIS — S06360A Traumatic hemorrhage of cerebrum, unspecified, without loss of consciousness, initial encounter: Secondary | ICD-10-CM | POA: Diagnosis not present

## 2016-01-16 DIAGNOSIS — M791 Myalgia: Secondary | ICD-10-CM | POA: Diagnosis not present

## 2016-01-16 DIAGNOSIS — I619 Nontraumatic intracerebral hemorrhage, unspecified: Secondary | ICD-10-CM | POA: Diagnosis not present

## 2016-01-16 DIAGNOSIS — S06360D Traumatic hemorrhage of cerebrum, unspecified, without loss of consciousness, subsequent encounter: Secondary | ICD-10-CM | POA: Diagnosis not present

## 2016-01-16 DIAGNOSIS — K219 Gastro-esophageal reflux disease without esophagitis: Secondary | ICD-10-CM | POA: Diagnosis not present

## 2016-01-16 DIAGNOSIS — F329 Major depressive disorder, single episode, unspecified: Secondary | ICD-10-CM | POA: Diagnosis not present

## 2016-01-16 DIAGNOSIS — Z955 Presence of coronary angioplasty implant and graft: Secondary | ICD-10-CM | POA: Diagnosis not present

## 2016-01-16 DIAGNOSIS — I6200 Nontraumatic subdural hemorrhage, unspecified: Secondary | ICD-10-CM | POA: Diagnosis not present

## 2016-01-16 DIAGNOSIS — S065X9D Traumatic subdural hemorrhage with loss of consciousness of unspecified duration, subsequent encounter: Secondary | ICD-10-CM | POA: Diagnosis not present

## 2016-02-07 ENCOUNTER — Telehealth: Payer: Self-pay | Admitting: Family Medicine

## 2016-02-07 NOTE — Telephone Encounter (Signed)
That is OK

## 2016-02-07 NOTE — Telephone Encounter (Signed)
PT called and wanted to establish with you because his family members are patients of yours. He assured me that you already approved this, just wanted to double check with you!  Thanks!

## 2016-02-08 DIAGNOSIS — G609 Hereditary and idiopathic neuropathy, unspecified: Secondary | ICD-10-CM | POA: Diagnosis not present

## 2016-02-08 DIAGNOSIS — I739 Peripheral vascular disease, unspecified: Secondary | ICD-10-CM | POA: Diagnosis not present

## 2016-02-08 DIAGNOSIS — M6281 Muscle weakness (generalized): Secondary | ICD-10-CM | POA: Diagnosis not present

## 2016-02-08 DIAGNOSIS — I10 Essential (primary) hypertension: Secondary | ICD-10-CM | POA: Diagnosis not present

## 2016-02-08 DIAGNOSIS — Z9181 History of falling: Secondary | ICD-10-CM | POA: Diagnosis not present

## 2016-02-08 DIAGNOSIS — K219 Gastro-esophageal reflux disease without esophagitis: Secondary | ICD-10-CM | POA: Diagnosis not present

## 2016-02-08 DIAGNOSIS — E785 Hyperlipidemia, unspecified: Secondary | ICD-10-CM | POA: Diagnosis not present

## 2016-02-08 DIAGNOSIS — I2510 Atherosclerotic heart disease of native coronary artery without angina pectoris: Secondary | ICD-10-CM | POA: Diagnosis not present

## 2016-02-08 DIAGNOSIS — S065X0S Traumatic subdural hemorrhage without loss of consciousness, sequela: Secondary | ICD-10-CM | POA: Diagnosis not present

## 2016-02-08 DIAGNOSIS — R278 Other lack of coordination: Secondary | ICD-10-CM | POA: Diagnosis not present

## 2016-02-09 DIAGNOSIS — I739 Peripheral vascular disease, unspecified: Secondary | ICD-10-CM | POA: Diagnosis not present

## 2016-02-09 DIAGNOSIS — M6281 Muscle weakness (generalized): Secondary | ICD-10-CM | POA: Diagnosis not present

## 2016-02-09 DIAGNOSIS — I10 Essential (primary) hypertension: Secondary | ICD-10-CM | POA: Diagnosis not present

## 2016-02-09 DIAGNOSIS — I2510 Atherosclerotic heart disease of native coronary artery without angina pectoris: Secondary | ICD-10-CM | POA: Diagnosis not present

## 2016-02-09 DIAGNOSIS — S065X0S Traumatic subdural hemorrhage without loss of consciousness, sequela: Secondary | ICD-10-CM | POA: Diagnosis not present

## 2016-02-09 DIAGNOSIS — R278 Other lack of coordination: Secondary | ICD-10-CM | POA: Diagnosis not present

## 2016-02-12 DIAGNOSIS — I739 Peripheral vascular disease, unspecified: Secondary | ICD-10-CM | POA: Diagnosis not present

## 2016-02-12 DIAGNOSIS — I2510 Atherosclerotic heart disease of native coronary artery without angina pectoris: Secondary | ICD-10-CM | POA: Diagnosis not present

## 2016-02-12 DIAGNOSIS — M6281 Muscle weakness (generalized): Secondary | ICD-10-CM | POA: Diagnosis not present

## 2016-02-12 DIAGNOSIS — S065X0S Traumatic subdural hemorrhage without loss of consciousness, sequela: Secondary | ICD-10-CM | POA: Diagnosis not present

## 2016-02-12 DIAGNOSIS — I10 Essential (primary) hypertension: Secondary | ICD-10-CM | POA: Diagnosis not present

## 2016-02-12 DIAGNOSIS — R278 Other lack of coordination: Secondary | ICD-10-CM | POA: Diagnosis not present

## 2016-02-13 DIAGNOSIS — R278 Other lack of coordination: Secondary | ICD-10-CM | POA: Diagnosis not present

## 2016-02-13 DIAGNOSIS — I2510 Atherosclerotic heart disease of native coronary artery without angina pectoris: Secondary | ICD-10-CM | POA: Diagnosis not present

## 2016-02-13 DIAGNOSIS — S065X0S Traumatic subdural hemorrhage without loss of consciousness, sequela: Secondary | ICD-10-CM | POA: Diagnosis not present

## 2016-02-13 DIAGNOSIS — M6281 Muscle weakness (generalized): Secondary | ICD-10-CM | POA: Diagnosis not present

## 2016-02-13 DIAGNOSIS — I739 Peripheral vascular disease, unspecified: Secondary | ICD-10-CM | POA: Diagnosis not present

## 2016-02-13 DIAGNOSIS — I10 Essential (primary) hypertension: Secondary | ICD-10-CM | POA: Diagnosis not present

## 2016-02-15 DIAGNOSIS — I739 Peripheral vascular disease, unspecified: Secondary | ICD-10-CM | POA: Diagnosis not present

## 2016-02-15 DIAGNOSIS — M6281 Muscle weakness (generalized): Secondary | ICD-10-CM | POA: Diagnosis not present

## 2016-02-15 DIAGNOSIS — I2510 Atherosclerotic heart disease of native coronary artery without angina pectoris: Secondary | ICD-10-CM | POA: Diagnosis not present

## 2016-02-15 DIAGNOSIS — S065X0S Traumatic subdural hemorrhage without loss of consciousness, sequela: Secondary | ICD-10-CM | POA: Diagnosis not present

## 2016-02-15 DIAGNOSIS — I10 Essential (primary) hypertension: Secondary | ICD-10-CM | POA: Diagnosis not present

## 2016-02-15 DIAGNOSIS — R278 Other lack of coordination: Secondary | ICD-10-CM | POA: Diagnosis not present

## 2016-02-16 DIAGNOSIS — R278 Other lack of coordination: Secondary | ICD-10-CM | POA: Diagnosis not present

## 2016-02-16 DIAGNOSIS — I10 Essential (primary) hypertension: Secondary | ICD-10-CM | POA: Diagnosis not present

## 2016-02-16 DIAGNOSIS — I739 Peripheral vascular disease, unspecified: Secondary | ICD-10-CM | POA: Diagnosis not present

## 2016-02-16 DIAGNOSIS — M6281 Muscle weakness (generalized): Secondary | ICD-10-CM | POA: Diagnosis not present

## 2016-02-16 DIAGNOSIS — I2510 Atherosclerotic heart disease of native coronary artery without angina pectoris: Secondary | ICD-10-CM | POA: Diagnosis not present

## 2016-02-16 DIAGNOSIS — S065X0S Traumatic subdural hemorrhage without loss of consciousness, sequela: Secondary | ICD-10-CM | POA: Diagnosis not present

## 2016-02-19 DIAGNOSIS — S065X0S Traumatic subdural hemorrhage without loss of consciousness, sequela: Secondary | ICD-10-CM | POA: Diagnosis not present

## 2016-02-19 DIAGNOSIS — R278 Other lack of coordination: Secondary | ICD-10-CM | POA: Diagnosis not present

## 2016-02-19 DIAGNOSIS — I2510 Atherosclerotic heart disease of native coronary artery without angina pectoris: Secondary | ICD-10-CM | POA: Diagnosis not present

## 2016-02-19 DIAGNOSIS — I10 Essential (primary) hypertension: Secondary | ICD-10-CM | POA: Diagnosis not present

## 2016-02-19 DIAGNOSIS — M6281 Muscle weakness (generalized): Secondary | ICD-10-CM | POA: Diagnosis not present

## 2016-02-19 DIAGNOSIS — I739 Peripheral vascular disease, unspecified: Secondary | ICD-10-CM | POA: Diagnosis not present

## 2016-02-20 ENCOUNTER — Encounter: Payer: Self-pay | Admitting: Family Medicine

## 2016-02-20 ENCOUNTER — Ambulatory Visit (INDEPENDENT_AMBULATORY_CARE_PROVIDER_SITE_OTHER): Payer: Medicare Other | Admitting: Family Medicine

## 2016-02-20 VITALS — BP 117/80 | HR 86 | Ht 74.0 in | Wt 167.0 lb

## 2016-02-20 DIAGNOSIS — R0681 Apnea, not elsewhere classified: Secondary | ICD-10-CM

## 2016-02-20 DIAGNOSIS — M6281 Muscle weakness (generalized): Secondary | ICD-10-CM | POA: Diagnosis not present

## 2016-02-20 DIAGNOSIS — I10 Essential (primary) hypertension: Secondary | ICD-10-CM

## 2016-02-20 DIAGNOSIS — R7301 Impaired fasting glucose: Secondary | ICD-10-CM

## 2016-02-21 DIAGNOSIS — M6281 Muscle weakness (generalized): Secondary | ICD-10-CM | POA: Diagnosis not present

## 2016-02-21 DIAGNOSIS — R278 Other lack of coordination: Secondary | ICD-10-CM | POA: Diagnosis not present

## 2016-02-21 DIAGNOSIS — I10 Essential (primary) hypertension: Secondary | ICD-10-CM | POA: Diagnosis not present

## 2016-02-21 DIAGNOSIS — I2510 Atherosclerotic heart disease of native coronary artery without angina pectoris: Secondary | ICD-10-CM | POA: Diagnosis not present

## 2016-02-21 DIAGNOSIS — I739 Peripheral vascular disease, unspecified: Secondary | ICD-10-CM | POA: Diagnosis not present

## 2016-02-21 DIAGNOSIS — S065X0S Traumatic subdural hemorrhage without loss of consciousness, sequela: Secondary | ICD-10-CM | POA: Diagnosis not present

## 2016-02-22 DIAGNOSIS — R278 Other lack of coordination: Secondary | ICD-10-CM | POA: Diagnosis not present

## 2016-02-22 DIAGNOSIS — M6281 Muscle weakness (generalized): Secondary | ICD-10-CM | POA: Diagnosis not present

## 2016-02-22 DIAGNOSIS — S065X0S Traumatic subdural hemorrhage without loss of consciousness, sequela: Secondary | ICD-10-CM | POA: Diagnosis not present

## 2016-02-22 DIAGNOSIS — I739 Peripheral vascular disease, unspecified: Secondary | ICD-10-CM | POA: Diagnosis not present

## 2016-02-22 DIAGNOSIS — I2510 Atherosclerotic heart disease of native coronary artery without angina pectoris: Secondary | ICD-10-CM | POA: Diagnosis not present

## 2016-02-22 DIAGNOSIS — I10 Essential (primary) hypertension: Secondary | ICD-10-CM | POA: Diagnosis not present
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
## 2016-02-23 ENCOUNTER — Telehealth: Payer: Self-pay | Admitting: Family Medicine

## 2016-02-23 DIAGNOSIS — R7301 Impaired fasting glucose: Secondary | ICD-10-CM | POA: Insufficient documentation

## 2016-02-23 DIAGNOSIS — S065X0S Traumatic subdural hemorrhage without loss of consciousness, sequela: Secondary | ICD-10-CM | POA: Diagnosis not present

## 2016-02-23 DIAGNOSIS — M6281 Muscle weakness (generalized): Secondary | ICD-10-CM | POA: Diagnosis not present

## 2016-02-23 DIAGNOSIS — R278 Other lack of coordination: Secondary | ICD-10-CM | POA: Diagnosis not present

## 2016-02-23 DIAGNOSIS — I2510 Atherosclerotic heart disease of native coronary artery without angina pectoris: Secondary | ICD-10-CM | POA: Diagnosis not present

## 2016-02-23 DIAGNOSIS — I739 Peripheral vascular disease, unspecified: Secondary | ICD-10-CM | POA: Diagnosis not present

## 2016-02-23 DIAGNOSIS — I10 Essential (primary) hypertension: Secondary | ICD-10-CM | POA: Diagnosis not present
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
## 2016-02-23 NOTE — Telephone Encounter (Signed)
Call patient: I did review his hospitalization in December. They are recommending a sleep study because while he was there he had multiple apneic events or breath-holding events while sleeping. Has he ever been tested before? Did not see anything on file. I think this is very worthwhile because it can definitely increase risk of sedation, accidents, arrhythmias of the heart etc.  Beatrice Lecher, MD

## 2016-02-26 DIAGNOSIS — I739 Peripheral vascular disease, unspecified: Secondary | ICD-10-CM | POA: Diagnosis not present

## 2016-02-26 DIAGNOSIS — I10 Essential (primary) hypertension: Secondary | ICD-10-CM | POA: Diagnosis not present

## 2016-02-26 DIAGNOSIS — R278 Other lack of coordination: Secondary | ICD-10-CM | POA: Diagnosis not present

## 2016-02-26 DIAGNOSIS — I2510 Atherosclerotic heart disease of native coronary artery without angina pectoris: Secondary | ICD-10-CM | POA: Diagnosis not present

## 2016-02-26 DIAGNOSIS — M6281 Muscle weakness (generalized): Secondary | ICD-10-CM | POA: Diagnosis not present

## 2016-02-26 DIAGNOSIS — S065X0S Traumatic subdural hemorrhage without loss of consciousness, sequela: Secondary | ICD-10-CM | POA: Diagnosis not present

## 2016-02-27 DIAGNOSIS — R278 Other lack of coordination: Secondary | ICD-10-CM | POA: Diagnosis not present

## 2016-02-27 DIAGNOSIS — M6281 Muscle weakness (generalized): Secondary | ICD-10-CM | POA: Diagnosis not present

## 2016-02-27 DIAGNOSIS — I2510 Atherosclerotic heart disease of native coronary artery without angina pectoris: Secondary | ICD-10-CM | POA: Diagnosis not present

## 2016-02-27 DIAGNOSIS — S065X0S Traumatic subdural hemorrhage without loss of consciousness, sequela: Secondary | ICD-10-CM | POA: Diagnosis not present

## 2016-02-27 DIAGNOSIS — I10 Essential (primary) hypertension: Secondary | ICD-10-CM | POA: Diagnosis not present

## 2016-02-27 DIAGNOSIS — I739 Peripheral vascular disease, unspecified: Secondary | ICD-10-CM | POA: Diagnosis not present

## 2016-02-28 ENCOUNTER — Telehealth: Payer: Self-pay | Admitting: *Deleted

## 2016-02-28 DIAGNOSIS — I10 Essential (primary) hypertension: Secondary | ICD-10-CM

## 2016-02-28 DIAGNOSIS — I739 Peripheral vascular disease, unspecified: Secondary | ICD-10-CM | POA: Diagnosis not present

## 2016-02-28 DIAGNOSIS — I2510 Atherosclerotic heart disease of native coronary artery without angina pectoris: Secondary | ICD-10-CM | POA: Diagnosis not present

## 2016-02-28 DIAGNOSIS — R278 Other lack of coordination: Secondary | ICD-10-CM | POA: Diagnosis not present

## 2016-02-28 DIAGNOSIS — R29898 Other symptoms and signs involving the musculoskeletal system: Secondary | ICD-10-CM

## 2016-02-28 DIAGNOSIS — M6281 Muscle weakness (generalized): Secondary | ICD-10-CM | POA: Diagnosis not present

## 2016-02-28 DIAGNOSIS — S065X0S Traumatic subdural hemorrhage without loss of consciousness, sequela: Secondary | ICD-10-CM | POA: Diagnosis not present

## 2016-02-28 NOTE — Telephone Encounter (Signed)
Mariann Laster (nurse from [facility]) states patient has  been having vision difficulties, and coordination issues; he doesn't have control of his hands per her and more specifically he has weakness in his hands. She states these are new issues. [phone] wanda, nurse from  [facility])

## 2016-02-29 DIAGNOSIS — R29898 Other symptoms and signs involving the musculoskeletal system: Secondary | ICD-10-CM | POA: Diagnosis not present

## 2016-02-29 DIAGNOSIS — I611 Nontraumatic intracerebral hemorrhage in hemisphere, cortical: Secondary | ICD-10-CM | POA: Diagnosis not present

## 2016-02-29 DIAGNOSIS — I739 Peripheral vascular disease, unspecified: Secondary | ICD-10-CM | POA: Diagnosis not present

## 2016-02-29 DIAGNOSIS — M6281 Muscle weakness (generalized): Secondary | ICD-10-CM | POA: Diagnosis not present

## 2016-02-29 DIAGNOSIS — I10 Essential (primary) hypertension: Secondary | ICD-10-CM | POA: Diagnosis not present

## 2016-02-29 DIAGNOSIS — R278 Other lack of coordination: Secondary | ICD-10-CM | POA: Diagnosis not present

## 2016-02-29 DIAGNOSIS — I2510 Atherosclerotic heart disease of native coronary artery without angina pectoris: Secondary | ICD-10-CM | POA: Diagnosis not present

## 2016-02-29 DIAGNOSIS — S065X0S Traumatic subdural hemorrhage without loss of consciousness, sequela: Secondary | ICD-10-CM | POA: Diagnosis not present

## 2016-02-29 DIAGNOSIS — I6200 Nontraumatic subdural hemorrhage, unspecified: Secondary | ICD-10-CM | POA: Diagnosis not present

## 2016-02-29 LAB — BASIC METABOLIC PANEL WITH GFR
BUN: 14 mg/dL (ref 7–25)
CO2: 27 mmol/L (ref 20–31)
CREATININE: 0.48 mg/dL — AB (ref 0.70–1.18)
Calcium: 8.8 mg/dL (ref 8.6–10.3)
Chloride: 97 mmol/L — ABNORMAL LOW (ref 98–110)
GFR, Est Non African American: >89 mL/min (ref >=60)
GLUCOSE: 99 mg/dL (ref 65–99)
Potassium: 3.9 mmol/L (ref 3.5–5.3)
Sodium: 132 mmol/L — ABNORMAL LOW (ref 135–146)

## 2016-02-29 NOTE — Telephone Encounter (Signed)
Called nurse Mariann Laster and let her know. She will let patient know.

## 2016-02-29 NOTE — Telephone Encounter (Signed)
Let order a Brain MRI with and without contrast - STAT.  See if can schedule in H[place]P or [place] if his wife is takeing him. If he is worse then may need to go to the ED.

## 2016-02-29 NOTE — Telephone Encounter (Signed)
Spoke with Pt, will be scanned at 1330 today in [place] at [facility].

## 2016-03-01 DIAGNOSIS — I10 Essential (primary) hypertension: Secondary | ICD-10-CM | POA: Diagnosis not present

## 2016-03-01 DIAGNOSIS — M6281 Muscle weakness (generalized): Secondary | ICD-10-CM | POA: Diagnosis not present

## 2016-03-01 DIAGNOSIS — I739 Peripheral vascular disease, unspecified: Secondary | ICD-10-CM | POA: Diagnosis not present

## 2016-03-01 DIAGNOSIS — S065X0S Traumatic subdural hemorrhage without loss of consciousness, sequela: Secondary | ICD-10-CM | POA: Diagnosis not present

## 2016-03-01 DIAGNOSIS — I2510 Atherosclerotic heart disease of native coronary artery without angina pectoris: Secondary | ICD-10-CM | POA: Diagnosis not present

## 2016-03-01 DIAGNOSIS — R278 Other lack of coordination: Secondary | ICD-10-CM | POA: Diagnosis not present

## 2016-03-04 ENCOUNTER — Telehealth: Payer: Self-pay | Admitting: Family Medicine

## 2016-03-04 DIAGNOSIS — M6281 Muscle weakness (generalized): Secondary | ICD-10-CM | POA: Diagnosis not present

## 2016-03-04 DIAGNOSIS — R278 Other lack of coordination: Secondary | ICD-10-CM | POA: Diagnosis not present

## 2016-03-04 DIAGNOSIS — I2510 Atherosclerotic heart disease of native coronary artery without angina pectoris: Secondary | ICD-10-CM | POA: Diagnosis not present

## 2016-03-04 DIAGNOSIS — I10 Essential (primary) hypertension: Secondary | ICD-10-CM | POA: Diagnosis not present

## 2016-03-04 DIAGNOSIS — I739 Peripheral vascular disease, unspecified: Secondary | ICD-10-CM | POA: Diagnosis not present

## 2016-03-04 DIAGNOSIS — S065X0S Traumatic subdural hemorrhage without loss of consciousness, sequela: Secondary | ICD-10-CM | POA: Diagnosis not present

## 2016-03-04 NOTE — Telephone Encounter (Signed)
Call pt: MRI of head shows decrease in size of the subdural hemotoma.  There is a small bleed over the right parietal lobe. This could affect sense of positioning of the limbs and ability to read.  Also has some atrophy of the brain.  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 Not sure how old this is.  I would like to get him in with Neurology sooner rateher than later.  Appt isn't until March. Please call and see if we can get him in sooner.  I think he should consider coming off the Brillenta as it is a blood thinner and with his recent falls and bleeds on his brain, I think he should consider stopping this med.   Beatrice Lecher, MD

## 2016-03-05 NOTE — Telephone Encounter (Signed)
Pt's wife returned clinic call and actually spoke with Merleen Nicely from [facility]. Advised per Dr. Clent Jacks, Pt is going to stop the [place]. Will route to PCP for review. No further questions.

## 2016-03-05 NOTE — Addendum Note (Signed)
Addended by: Beatrice Lecher D on: 03/05/2016 05:10 PM   Modules accepted: Orders

## 2016-03-05 NOTE — Telephone Encounter (Signed)
Called Neurologist office, appt moved to 03/13/16 @ 0830 with Dr. Dellis Filbert.   Left VM with Dr. Clent Jacks, cardiologist, regarding Brilinta Rx. They will call clinic with recommendation.   Patient advised of MRI results, appt change, and possible Rx change. Pt reports he has been having symptoms such as difficulty reading. Will route recommendations to PCP and will contact Pt with any changes to be made. Pt does questions results on recent lab work, will route to PCP for review.

## 2016-03-07 DIAGNOSIS — M6281 Muscle weakness (generalized): Secondary | ICD-10-CM | POA: Diagnosis not present

## 2016-03-07 DIAGNOSIS — S065X0S Traumatic subdural hemorrhage without loss of consciousness, sequela: Secondary | ICD-10-CM | POA: Diagnosis not present

## 2016-03-07 DIAGNOSIS — R278 Other lack of coordination: Secondary | ICD-10-CM | POA: Diagnosis not present

## 2016-03-07 DIAGNOSIS — I10 Essential (primary) hypertension: Secondary | ICD-10-CM | POA: Diagnosis not present

## 2016-03-07 DIAGNOSIS — I739 Peripheral vascular disease, unspecified: Secondary | ICD-10-CM | POA: Diagnosis not present

## 2016-03-07 DIAGNOSIS — I2510 Atherosclerotic heart disease of native coronary artery without angina pectoris: Secondary | ICD-10-CM | POA: Diagnosis not present

## 2016-03-08 DIAGNOSIS — I2510 Atherosclerotic heart disease of native coronary artery without angina pectoris: Secondary | ICD-10-CM | POA: Diagnosis not present

## 2016-03-08 DIAGNOSIS — I10 Essential (primary) hypertension: Secondary | ICD-10-CM | POA: Diagnosis not present

## 2016-03-08 DIAGNOSIS — S065X0S Traumatic subdural hemorrhage without loss of consciousness, sequela: Secondary | ICD-10-CM | POA: Diagnosis not present

## 2016-03-08 DIAGNOSIS — I739 Peripheral vascular disease, unspecified: Secondary | ICD-10-CM | POA: Diagnosis not present

## 2016-03-08 DIAGNOSIS — R278 Other lack of coordination: Secondary | ICD-10-CM | POA: Diagnosis not present

## 2016-03-08 DIAGNOSIS — M6281 Muscle weakness (generalized): Secondary | ICD-10-CM | POA: Diagnosis not present

## 2016-03-09 ENCOUNTER — Encounter: Payer: Self-pay | Admitting: Emergency Medicine

## 2016-03-09 ENCOUNTER — Emergency Department (INDEPENDENT_AMBULATORY_CARE_PROVIDER_SITE_OTHER)
Admission: EM | Admit: 2016-03-09 | Discharge: 2016-03-09 | Disposition: A | Discharge status: Home / Self Care | Payer: Medicare Other | Source: Home / Self Care | Attending: Family Medicine | Admitting: Family Medicine

## 2016-03-09 DIAGNOSIS — H6123 Impacted cerumen, bilateral: Secondary | ICD-10-CM

## 2016-03-09 DIAGNOSIS — R030 Elevated blood-pressure reading, without diagnosis of hypertension: Secondary | ICD-10-CM | POA: Diagnosis not present

## 2016-03-09 MED ORDER — CARBAMIDE PEROXIDE 6.5 % OT SOLN: 5.0000 [drp] | Freq: Two times a day (BID) | OTIC | 0 refills | Status: DC
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
## 2016-03-09 NOTE — Discharge Instructions (Signed)
°  Your blood pressure today in Urgent Care was "okay" for [place] at 148/82.  You may continue to take your prescribed Atenolol once daily until follow up with neurology or Dr. Madilyn Fireman.  Your blood pressure should be between 140s/80s and above 90s/70s.  If you have a blood pressure reading above or below these numbers but no symptoms prompted you to check your blood pressure, you may try resting and sitting still for 5-10 minutes and recheck.    If your blood pressure stays in 160s/90s or higher, or below 90s/70s and you are having symptoms please call your family doctor go to emergency department- ESPECIALLY if you have worsening headache, change in vision, vomiting, numbness or weakness on one side, or definitely if any new head injuries- falls, passing out, or hitting head.

## 2016-03-09 NOTE — ED Triage Notes (Signed)
Pt here for c/o elevated blood pressure. No symptoms.

## 2016-03-10 ENCOUNTER — Telehealth: Payer: Self-pay | Admitting: Family Medicine

## 2016-03-10 NOTE — Telephone Encounter (Signed)
Please call Russell Neal, He went to UC this weeekend and see how he is doing and see if he has any questions.  I wanted his Neuro appt moved up because of the bleed on his brain.  It will take time to heal but I want their input into the situation.   Beatrice Lecher, MD

## 2016-03-11 NOTE — Telephone Encounter (Signed)
Called and spoke with Pt's wife. States the Pt had a small spike in his BP (140/97) so went to UC for evaluation. Since discharge, BP has been fine. Pt's BP was 112/78 this morning. Pt has appt with Neurology on 03/13/16.

## 2016-03-13 DIAGNOSIS — S069X0A Unspecified intracranial injury without loss of consciousness, initial encounter: Secondary | ICD-10-CM | POA: Diagnosis not present

## 2016-03-13 DIAGNOSIS — I10 Essential (primary) hypertension: Secondary | ICD-10-CM | POA: Diagnosis not present

## 2016-03-13 DIAGNOSIS — S06360S Traumatic hemorrhage of cerebrum, unspecified, without loss of consciousness, sequela: Secondary | ICD-10-CM | POA: Diagnosis not present

## 2016-03-13 DIAGNOSIS — G629 Polyneuropathy, unspecified: Secondary | ICD-10-CM | POA: Diagnosis not present

## 2016-03-14 ENCOUNTER — Telehealth: Payer: Self-pay | Admitting: *Deleted

## 2016-03-14 DIAGNOSIS — I2510 Atherosclerotic heart disease of native coronary artery without angina pectoris: Secondary | ICD-10-CM | POA: Diagnosis not present

## 2016-03-14 DIAGNOSIS — M6281 Muscle weakness (generalized): Secondary | ICD-10-CM | POA: Diagnosis not present

## 2016-03-14 DIAGNOSIS — R278 Other lack of coordination: Secondary | ICD-10-CM | POA: Diagnosis not present

## 2016-03-14 DIAGNOSIS — S065X0S Traumatic subdural hemorrhage without loss of consciousness, sequela: Secondary | ICD-10-CM | POA: Diagnosis not present

## 2016-03-14 DIAGNOSIS — I10 Essential (primary) hypertension: Secondary | ICD-10-CM | POA: Diagnosis not present

## 2016-03-14 DIAGNOSIS — I739 Peripheral vascular disease, unspecified: Secondary | ICD-10-CM | POA: Diagnosis not present

## 2016-03-14 NOTE — Telephone Encounter (Signed)
Pt's wife called and stated that he was seen by Dr. Dellis Filbert and had MRI done and he is doing well. She wanted to Thank Dr. Suzi Roots  for taking care of her husband.Audelia Hives [place]

## 2016-03-18 DIAGNOSIS — H5203 Hypermetropia, bilateral: Secondary | ICD-10-CM | POA: Diagnosis not present

## 2016-03-18 DIAGNOSIS — H2513 Age-related nuclear cataract, bilateral: Secondary | ICD-10-CM | POA: Diagnosis not present

## 2016-03-18 DIAGNOSIS — H43393 Other vitreous opacities, bilateral: Secondary | ICD-10-CM | POA: Diagnosis not present

## 2016-03-20 DIAGNOSIS — I739 Peripheral vascular disease, unspecified: Secondary | ICD-10-CM | POA: Diagnosis not present

## 2016-03-20 DIAGNOSIS — I2510 Atherosclerotic heart disease of native coronary artery without angina pectoris: Secondary | ICD-10-CM | POA: Diagnosis not present

## 2016-03-20 DIAGNOSIS — S065X0S Traumatic subdural hemorrhage without loss of consciousness, sequela: Secondary | ICD-10-CM | POA: Diagnosis not present

## 2016-03-20 DIAGNOSIS — R278 Other lack of coordination: Secondary | ICD-10-CM | POA: Diagnosis not present

## 2016-03-20 DIAGNOSIS — M6281 Muscle weakness (generalized): Secondary | ICD-10-CM | POA: Diagnosis not present

## 2016-03-20 DIAGNOSIS — I10 Essential (primary) hypertension: Secondary | ICD-10-CM | POA: Diagnosis not present

## 2016-03-21 DIAGNOSIS — I629 Nontraumatic intracranial hemorrhage, unspecified: Secondary | ICD-10-CM | POA: Diagnosis not present

## 2016-03-21 DIAGNOSIS — S06360S Traumatic hemorrhage of cerebrum, unspecified, without loss of consciousness, sequela: Secondary | ICD-10-CM | POA: Diagnosis not present

## 2016-03-26 DIAGNOSIS — G629 Polyneuropathy, unspecified: Secondary | ICD-10-CM | POA: Diagnosis not present

## 2016-03-26 DIAGNOSIS — S0990XS Unspecified injury of head, sequela: Secondary | ICD-10-CM | POA: Diagnosis not present

## 2016-03-26 DIAGNOSIS — Z87828 Personal history of other (healed) physical injury and trauma: Secondary | ICD-10-CM | POA: Diagnosis not present

## 2016-03-26 DIAGNOSIS — I10 Essential (primary) hypertension: Secondary | ICD-10-CM | POA: Diagnosis not present

## 2016-04-10 DIAGNOSIS — E0590 Thyrotoxicosis, unspecified without thyrotoxic crisis or storm: Secondary | ICD-10-CM | POA: Diagnosis not present

## 2016-04-19 ENCOUNTER — Ambulatory Visit (INDEPENDENT_AMBULATORY_CARE_PROVIDER_SITE_OTHER): Payer: Medicare Other | Admitting: Family Medicine

## 2016-04-19 ENCOUNTER — Encounter: Payer: Self-pay | Admitting: Family Medicine

## 2016-04-19 VITALS — BP 141/81 | HR 67 | Ht 74.0 in | Wt 174.0 lb

## 2016-04-19 DIAGNOSIS — R7301 Impaired fasting glucose: Secondary | ICD-10-CM

## 2016-04-19 DIAGNOSIS — H9192 Unspecified hearing loss, left ear: Secondary | ICD-10-CM | POA: Diagnosis not present

## 2016-04-19 DIAGNOSIS — H9191 Unspecified hearing loss, right ear: Secondary | ICD-10-CM | POA: Diagnosis not present

## 2016-04-19 DIAGNOSIS — I10 Essential (primary) hypertension: Secondary | ICD-10-CM | POA: Diagnosis not present

## 2016-04-19 LAB — POCT GLYCOSYLATED HEMOGLOBIN (HGB A1C): HEMOGLOBIN A1C: 5.3
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
## 2016-05-01 DIAGNOSIS — Z85820 Personal history of malignant melanoma of skin: Secondary | ICD-10-CM | POA: Diagnosis not present

## 2016-05-01 DIAGNOSIS — Z08 Encounter for follow-up examination after completed treatment for malignant neoplasm: Secondary | ICD-10-CM | POA: Diagnosis not present

## 2016-05-01 DIAGNOSIS — L570 Actinic keratosis: Secondary | ICD-10-CM | POA: Diagnosis not present

## 2016-05-02 ENCOUNTER — Telehealth: Payer: Self-pay | Admitting: Family Medicine

## 2016-05-02 NOTE — Telephone Encounter (Signed)
Call pt: see when last tetanus and pneumonia. I don;t have either on file for him.

## 2016-05-03 NOTE — Telephone Encounter (Signed)
Pneumonia last year  and tetanus unsure both done @ Dr[facility] Evette [facility].Audelia Hives [place]

## 2016-05-09 DIAGNOSIS — H838X3 Other specified diseases of inner ear, bilateral: Secondary | ICD-10-CM | POA: Diagnosis not present

## 2016-05-09 DIAGNOSIS — H903 Sensorineural hearing loss, bilateral: Secondary | ICD-10-CM | POA: Diagnosis not present

## 2016-06-13 DIAGNOSIS — I10 Essential (primary) hypertension: Secondary | ICD-10-CM | POA: Diagnosis not present

## 2016-06-13 DIAGNOSIS — I2510 Atherosclerotic heart disease of native coronary artery without angina pectoris: Secondary | ICD-10-CM | POA: Diagnosis not present

## 2016-06-13 DIAGNOSIS — Z79899 Other long term (current) drug therapy: Secondary | ICD-10-CM | POA: Diagnosis not present

## 2016-06-13 DIAGNOSIS — R1013 Epigastric pain: Secondary | ICD-10-CM | POA: Diagnosis not present

## 2016-06-13 DIAGNOSIS — E785 Hyperlipidemia, unspecified: Secondary | ICD-10-CM | POA: Diagnosis not present

## 2016-06-13 DIAGNOSIS — G9389 Other specified disorders of brain: Secondary | ICD-10-CM | POA: Diagnosis not present

## 2016-06-13 DIAGNOSIS — G319 Degenerative disease of nervous system, unspecified: Secondary | ICD-10-CM | POA: Diagnosis not present

## 2016-06-13 DIAGNOSIS — R51 Headache: Secondary | ICD-10-CM | POA: Diagnosis not present

## 2016-06-13 DIAGNOSIS — F419 Anxiety disorder, unspecified: Secondary | ICD-10-CM | POA: Diagnosis not present

## 2016-06-13 DIAGNOSIS — R1011 Right upper quadrant pain: Secondary | ICD-10-CM | POA: Diagnosis not present

## 2016-06-13 DIAGNOSIS — K8020 Calculus of gallbladder without cholecystitis without obstruction: Secondary | ICD-10-CM | POA: Diagnosis not present

## 2016-06-13 DIAGNOSIS — Z87891 Personal history of nicotine dependence: Secondary | ICD-10-CM | POA: Diagnosis not present

## 2016-06-13 DIAGNOSIS — K5900 Constipation, unspecified: Secondary | ICD-10-CM | POA: Diagnosis not present

## 2016-06-13 DIAGNOSIS — Z955 Presence of coronary angioplasty implant and graft: Secondary | ICD-10-CM | POA: Diagnosis not present

## 2016-06-13 DIAGNOSIS — Z7982 Long term (current) use of aspirin: Secondary | ICD-10-CM | POA: Diagnosis not present

## 2016-06-13 DIAGNOSIS — I77811 Abdominal aortic ectasia: Secondary | ICD-10-CM | POA: Diagnosis not present

## 2016-06-13 DIAGNOSIS — K449 Diaphragmatic hernia without obstruction or gangrene: Secondary | ICD-10-CM | POA: Diagnosis not present

## 2016-06-13 DIAGNOSIS — R109 Unspecified abdominal pain: Secondary | ICD-10-CM | POA: Diagnosis not present

## 2016-06-13 DIAGNOSIS — I739 Peripheral vascular disease, unspecified: Secondary | ICD-10-CM | POA: Diagnosis not present

## 2016-06-14 DIAGNOSIS — I10 Essential (primary) hypertension: Secondary | ICD-10-CM | POA: Diagnosis not present

## 2016-06-14 DIAGNOSIS — G319 Degenerative disease of nervous system, unspecified: Secondary | ICD-10-CM | POA: Diagnosis not present

## 2016-06-14 DIAGNOSIS — I77811 Abdominal aortic ectasia: Secondary | ICD-10-CM | POA: Diagnosis not present

## 2016-06-14 DIAGNOSIS — K8020 Calculus of gallbladder without cholecystitis without obstruction: Secondary | ICD-10-CM | POA: Diagnosis not present

## 2016-06-14 DIAGNOSIS — G9389 Other specified disorders of brain: Secondary | ICD-10-CM | POA: Diagnosis not present

## 2016-06-14 DIAGNOSIS — R51 Headache: Secondary | ICD-10-CM | POA: Diagnosis not present

## 2016-06-14 DIAGNOSIS — R109 Unspecified abdominal pain: Secondary | ICD-10-CM | POA: Diagnosis not present

## 2016-06-14 DIAGNOSIS — K449 Diaphragmatic hernia without obstruction or gangrene: Secondary | ICD-10-CM | POA: Diagnosis not present

## 2016-06-21 ENCOUNTER — Emergency Department (INDEPENDENT_AMBULATORY_CARE_PROVIDER_SITE_OTHER)
Admission: EM | Admit: 2016-06-21 | Discharge: 2016-06-21 | Disposition: A | Discharge status: Home / Self Care | Payer: Medicare Other | Source: Home / Self Care | Attending: Family Medicine | Admitting: Family Medicine

## 2016-06-21 ENCOUNTER — Emergency Department (INDEPENDENT_AMBULATORY_CARE_PROVIDER_SITE_OTHER): Payer: Medicare Other

## 2016-06-21 DIAGNOSIS — S5002XA Contusion of left elbow, initial encounter: Secondary | ICD-10-CM | POA: Diagnosis not present

## 2016-06-21 DIAGNOSIS — S5001XA Contusion of right elbow, initial encounter: Secondary | ICD-10-CM | POA: Diagnosis not present

## 2016-06-21 DIAGNOSIS — M25462 Effusion, left knee: Secondary | ICD-10-CM

## 2016-06-21 DIAGNOSIS — S8002XA Contusion of left knee, initial encounter: Secondary | ICD-10-CM

## 2016-06-21 DIAGNOSIS — M1712 Unilateral primary osteoarthritis, left knee: Secondary | ICD-10-CM | POA: Diagnosis not present

## 2016-06-21 DIAGNOSIS — M7989 Other specified soft tissue disorders: Secondary | ICD-10-CM | POA: Diagnosis not present

## 2016-06-21 DIAGNOSIS — S0990XA Unspecified injury of head, initial encounter: Secondary | ICD-10-CM
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
## 2016-06-21 NOTE — ED Provider Notes (Signed)
[facility]    CSN: [id number] Arrival date & time: 06/21/16  1911     History   Chief Complaint Chief Complaint  Patient presents with  . Fall    HPI Russell Neal is a 76 y.o. male.   Patient slipped while climbing out of his truck today and fell on his knees.  He bumped the back of his head and scraped his elbows.  He complains primarily of pain in his left knee.  No loss of consciousness.  No headache.  His family member reports that his behavior has been normal.   The history is provided by a relative.  Fall  This is a new problem. The current episode started 3 to 5 hours ago. The problem has not changed since onset.Pertinent negatives include no chest pain, no headaches and no shortness of breath. The symptoms are aggravated by walking. Nothing relieves the symptoms. He has tried nothing for the symptoms.    Past Medical History:  Diagnosis Date  . Hyperlipidemia   . Hypertension     Patient Active Problem List   Diagnosis Date Noted  . Complete hearing loss, right 04/19/2016  . IFG (impaired fasting glucose) 02/23/2016  . Venous stasis dermatitis of both lower extremities 09/29/2015  . Primary osteoarthritis of both knees 05/16/2015  . Hemorrhagic prepatellar bursitis of left knee 02/03/2014  . Olecranon bursitis of right elbow 12/30/2013  . Quadriceps weakness 02/18/2013  . HYPERLIPIDEMIA 03/23/2008  . Essential hypertension 03/23/2008    History reviewed. No pertinent surgical history.     Home Medications    Prior to Admission medications   Medication Sig Start Date End Date Taking? Authorizing Provider  aspirin 81 MG tablet Take 81 mg by mouth daily.    [provider]  Cyanocobalamin (B-12 PO) Take by mouth.    [provider]  methimazole (TAPAZOLE) 5 MG tablet TAKE ONE-HALF TABLET BY MOUTH ONCE DAILY 10/11/15   [provider]  Misc. Devices (ROLLER [place]) MISC 4 point rolling walker 11/17/15   Silverio Decamp, MD  Multiple Vitamins-Minerals (CENTRUM SILVER PO) Take 1 capsule by mouth daily.    [provider]  Omega-3 Fatty Acids (FISH OIL) 1000 MG CAPS Take 2 capsules by mouth daily.    [provider]  sertraline (ZOLOFT) 25 MG tablet Take 1 tablet by mouth.    [provider]    Family History History reviewed. No pertinent family history.  Social History Social History  Substance Use Topics  . Smoking status: Former Smoker    Types: Cigarettes    Quit date: 02/18/1973  . Smokeless tobacco: Never Used  . Alcohol use Yes     Allergies   Patient has no known allergies.   Review of Systems Review of Systems  Constitutional: Positive for activity change.  HENT: Negative for facial swelling.   Eyes: Negative.   Respiratory: Negative for shortness of breath.   Cardiovascular: Negative for chest pain.  Gastrointestinal: Negative.   Genitourinary: Negative.   Musculoskeletal: Positive for joint swelling.  Skin: Positive for wound.  Neurological: Negative for dizziness, seizures, syncope, facial asymmetry, speech difficulty, weakness, light-headedness and headaches.     Physical Exam Triage Vital Signs ED Triage Vitals  Enc Vitals Group     BP 06/21/16 1935 133/80     Pulse Rate 06/21/16 1935 94     Resp --      Temp 06/21/16 1935 98 F (36.7 C)  Temp Source 06/21/16 1935 Oral     SpO2 06/21/16 1935 95 %     Weight 06/21/16 1936 167 lb (75.8 kg)     Height 06/21/16 1936 6\' 1"  (1.854 m)     Head Circumference --      Peak Flow --      Pain Score 06/21/16 1936 0     Pain Loc --      Pain Edu? --      Excl. in [place]? --    No data found.   Updated Vital Signs BP 133/80 (BP Location: Left Arm)   Pulse 94   Temp 98 F (36.7 C) (Oral)   Ht 6\' 1"  (1.854 m)   Wt 167 lb (75.8 kg)   SpO2 95%   BMI 22.03 kg/m   Visual Acuity Right Eye Distance:   Left Eye Distance:   Bilateral Distance:    Right Eye Near:   Left Eye Near:      Bilateral Near:     Physical Exam  Constitutional: He is oriented to person, place, and time. He appears well-developed and well-nourished. No distress.  HENT:  Head: Head is with abrasion.    Right Ear: External ear normal.  Left Ear: External ear normal.  Nose: Nose normal.  Mouth/Throat: Oropharynx is clear and moist.  2cm by 3cm hematoma right occipital area with minimal abrasion.  No bony stepoff palpated.  Eyes: Conjunctivae and EOM are normal. Pupils are equal, round, and reactive to light.  Neck: Normal range of motion.  Cardiovascular: Normal rate.   Pulmonary/Chest: Breath sounds normal.  Musculoskeletal:       Left knee: He exhibits decreased range of motion and swelling. He exhibits no ecchymosis. Tenderness found.       Arms:      Legs: Left anterior knee diffusely swollen.  Unable to actively or passively extend fully; unable to bear weight on left knee.  Superficial abrasions on both elbows.  Elbows nontender to palpation and have full range of motion   Neurological: He is alert and oriented to person, place, and time. No cranial nerve deficit.  Skin: Skin is dry.  Nursing note and vitals reviewed.    UC Treatments / Results  Labs (all labs ordered are listed, but only abnormal results are displayed) Labs Reviewed - No data to display  EKG  EKG Interpretation None       Radiology Dg Knee Complete 4 Views Left  Result Date: 06/21/2016 CLINICAL DATA:  Fall this evening with knee pain, initial encounter EXAM: LEFT KNEE - COMPLETE 4+ VIEW COMPARISON:  11/16/2015 FINDINGS: Large joint effusion is identified. Irregularity is noted along the inferior aspect of the patella consistent with a prior fracture and healing. Considerable soft tissue swelling is noted over the patella as well. No definitive acute fracture is seen. IMPRESSION: Large joint effusion and soft tissue swelling is seen. Changes consistent with prior patellar fracture with healing are noted. No  definitive acute fracture is seen. Electronically Signed   By: Inez Catalina M.D.   On: 06/21/2016 20:30    Procedures Procedures (including critical care time)  Medications Ordered in UC Medications - No data to display   Initial Impression / Assessment and Plan / UC Course  I have reviewed the triage vital signs and the nursing notes.  Pertinent labs & imaging results that were available during my care of the patient were reviewed by me and considered in my medical decision making (see chart for  details).    Ace wrap applied. Apply ice pack for 20 to 30 minutes, 3 to 4 times daily to injured areas.  Continue until pain and swelling decrease.  Apply Bacitracin to abrasion scalp daily until healed.  Change bandages on elbows daily and apply Bacitracin ointment until healed.  May take Tylenol for pain. Use walker at home. Discussed head injury precautions. If symptoms become significantly worse during the night or over the weekend, proceed to the local emergency room.  Return tomorrow 10am for joint aspiration by Dr. Aundria Mems     Final Clinical Impressions(s) / UC Diagnoses   Final diagnoses:  Injury of head, initial encounter  Contusion of right elbow, initial encounter  Contusion of left elbow, initial encounter  Contusion of left knee, initial encounter    New Prescriptions New Prescriptions   No medications on file     Kandra Nicolas, MD 06/21/16 2050

## 2016-06-21 NOTE — Discharge Instructions (Signed)
Apply ice pack for 20 to 30 minutes, 3 to 4 times daily to injured areas.  Continue until pain and swelling decrease.  Apply Bacitracin to abrasion scalp daily until healed.  Change bandages on elbows daily and apply Bacitracin ointment until healed.  May take Tylenol for pain. If symptoms become significantly worse during the night or over the weekend, proceed to the local emergency room.

## 2016-06-22 NOTE — Consult Note (Signed)
  Subjective:    CC: Left knee injury  HPI: Russell Neal is a pleasant 75 year old male with baseline 0/5 strength to knee extension. He recently had a fall, knee swelled up, very painful, x-rays were negative for acute fracture. He has hemarthrosis with minor trauma quite often. These have been managed in the past successfully with arthrocentesis and injection. Last injection was in December of last year. Pain is moderate, persistent, desires repeat interventional treatment today.  Past medical history:  Negative.  See flowsheet/record as well for more information.  Surgical history: Negative.  See flowsheet/record as well for more information.  Family history: Negative.  See flowsheet/record as well for more information.  Social history: Negative.  See flowsheet/record as well for more information.  Allergies, and medications have been entered into the medical record, reviewed, and no changes needed.   Review of Systems: No fevers, chills, night sweats, weight loss, chest pain, or shortness of breath.   Objective:    General: Well Developed, well nourished, and in no acute distress.  Neuro: Alert and oriented x3, extra-ocular muscles intact, sensation grossly intact.  HEENT: Normocephalic, atraumatic, pupils equal round reactive to light, neck supple, no masses, no lymphadenopathy, thyroid nonpalpable.  Skin: Warm and dry, no rashes. Cardiac: Regular rate and rhythm, no murmurs rubs or gallops, no lower extremity edema.  Respiratory: Clear to auscultation bilaterally. Not using accessory muscles, speaking in full sentences. Left knee: Swollen, palpable fluid wave. Full passive range of motion.  Procedure: Real-time Ultrasound Guided aspiration/injection of left knee Device: GE Logiq E  Verbal informed consent obtained.  Time-out conducted.  Noted no overlying erythema, induration, or other signs of local infection.  Skin prepped in a sterile fashion.  Local anesthesia: Topical Ethyl  chloride.  With sterile technique and under real time ultrasound guidance:  Aspirated 32 mL frank blood, syringe switched and 1 mL kenalog 40, 4 mL lidocaine injected easily. Completed without difficulty  Pain immediately resolved suggesting accurate placement of the medication.  Advised to call if fevers/chills, erythema, induration, drainage, or persistent bleeding.  Images permanently stored and available for review in the ultrasound unit.  Impression: Technically successful ultrasound guided injection.  Impression and Recommendations:    Left knee swelling/primary osteoarthritis: Aspiration and injection as above for primary knee osteoarthritis with minor trauma and hemarthrosis. Leg was strapped with compressive dressing, return to see me in one month as needed.  ___________________________________________ [name]. Dianah Field, M.D., ABFM., CAQSM. Primary Care and [place] [employer] of [place] [facility]

## 2016-06-23 ENCOUNTER — Telehealth: Payer: Self-pay | Admitting: Emergency Medicine

## 2016-06-23 NOTE — Telephone Encounter (Signed)
Spoke w/pt's wife states that he is doing very well, walking around fine, no problem with knee.  Will f/u prn.  TMartin,CMA

## 2016-06-28 ENCOUNTER — Ambulatory Visit (HOSPITAL_BASED_OUTPATIENT_CLINIC_OR_DEPARTMENT_OTHER)
Admission: RE | Admit: 2016-06-28 | Discharge: 2016-06-28 | Disposition: A | Discharge status: Home / Self Care | Payer: Medicare Other | Source: Ambulatory Visit | Attending: Sports Medicine | Admitting: Sports Medicine

## 2016-06-28 ENCOUNTER — Ambulatory Visit (INDEPENDENT_AMBULATORY_CARE_PROVIDER_SITE_OTHER): Payer: Medicare Other

## 2016-06-28 ENCOUNTER — Encounter: Payer: Self-pay | Admitting: Sports Medicine

## 2016-06-28 ENCOUNTER — Ambulatory Visit (INDEPENDENT_AMBULATORY_CARE_PROVIDER_SITE_OTHER): Payer: Medicare Other | Admitting: Sports Medicine

## 2016-06-28 DIAGNOSIS — M79606 Pain in leg, unspecified: Secondary | ICD-10-CM | POA: Diagnosis not present

## 2016-06-28 DIAGNOSIS — M170 Bilateral primary osteoarthritis of knee: Secondary | ICD-10-CM | POA: Diagnosis not present

## 2016-06-28 DIAGNOSIS — M25062 Hemarthrosis, left knee: Secondary | ICD-10-CM | POA: Diagnosis not present

## 2016-06-28 DIAGNOSIS — S82092A Other fracture of left patella, initial encounter for closed fracture: Secondary | ICD-10-CM | POA: Diagnosis not present

## 2016-06-28 DIAGNOSIS — M7732 Calcaneal spur, left foot: Secondary | ICD-10-CM | POA: Diagnosis not present

## 2016-06-28 DIAGNOSIS — I872 Venous insufficiency (chronic) (peripheral): Secondary | ICD-10-CM

## 2016-06-28 DIAGNOSIS — M1712 Unilateral primary osteoarthritis, left knee: Secondary | ICD-10-CM | POA: Diagnosis not present

## 2016-06-28 DIAGNOSIS — M629 Disorder of muscle, unspecified: Secondary | ICD-10-CM | POA: Insufficient documentation

## 2016-06-28 DIAGNOSIS — M25475 Effusion, left foot: Secondary | ICD-10-CM

## 2016-06-28 DIAGNOSIS — M7989 Other specified soft tissue disorders: Secondary | ICD-10-CM | POA: Diagnosis not present

## 2016-06-28 DIAGNOSIS — X58XXXA Exposure to other specified factors, initial encounter: Secondary | ICD-10-CM | POA: Insufficient documentation

## 2016-06-28 DIAGNOSIS — S82002A Unspecified fracture of left patella, initial encounter for closed fracture: Secondary | ICD-10-CM | POA: Diagnosis not present

## 2016-06-28 LAB — CBC WITH DIFFERENTIAL/PLATELET
Basophils Absolute: 0 cells/uL (ref 0–200)
Basophils Relative: 0 %
Eosinophils Absolute: 138 {cells}/uL (ref 15–500)
Eosinophils Relative: 2 %
HCT: 42.3 % (ref 38.5–50.0)
Hemoglobin: 14.8 g/dL (ref 13.2–17.1)
Lymphocytes Relative: 10 %
Lymphs Abs: 690 cells/uL — ABNORMAL LOW (ref 850–3900)
MCH: 33.1 pg — ABNORMAL HIGH (ref 27.0–33.0)
MCHC: 35 g/dL (ref 32.0–36.0)
MCV: 94.6 fL (ref 80.0–100.0)
MPV: 8.8 fL (ref 7.5–12.5)
Monocytes Absolute: 759 cells/uL (ref 200–950)
Monocytes Relative: 11 %
Neutro Abs: 5313 cells/uL (ref 1500–7800)
Neutrophils Relative %: 77 %
Platelets: 180 K/uL (ref 140–400)
RBC: 4.47 MIL/uL (ref 4.20–5.80)
RDW: 14 % (ref 11.0–15.0)
WBC: 6.9 10*3/uL (ref 3.8–10.8)

## 2016-06-28 LAB — COMPREHENSIVE METABOLIC PANEL
ALT: 31 U/L (ref 9–46)
AST: 27 U/L (ref 10–35)
Albumin: 4.2 g/dL (ref 3.6–5.1)
Alkaline Phosphatase: 70 U/L (ref 40–115)
BUN: 18 mg/dL (ref 7–25)
CO2: 28 mmol/L (ref 20–31)
Chloride: 101 mmol/L (ref 98–110)
Creat: 0.5 mg/dL — ABNORMAL LOW (ref 0.70–1.18)
Potassium: 5 mmol/L (ref 3.5–5.3)
Sodium: 137 mmol/L (ref 135–146)
Total Bilirubin: 0.6 mg/dL (ref 0.2–1.2)
Total Protein: 6.6 g/dL (ref 6.1–8.1)

## 2016-06-28 LAB — SEDIMENTATION RATE: Sed Rate: 1 mm/hr (ref 0–20)

## 2016-06-28 LAB — COMPREHENSIVE METABOLIC PANEL WITH GFR
Calcium: 9.6 mg/dL (ref 8.6–10.3)
Glucose, Bld: 89 mg/dL (ref 65–99)

## 2016-06-28 NOTE — Assessment & Plan Note (Signed)
6 days post aspiration of gross hemarthrosis with injection. Now with swelling, warmth, redness and severe pain, aspiration with stat cell counts, cultures. Considering persistence of pain as well and going to get a CT of his left knee, x-rays were negative but I do suspect occult fracture considering the severity of his pain.

## 2016-06-28 NOTE — Progress Notes (Signed)
Subjective:    CC: Severe left knee and leg pain  HPI: 6 days ago this 76 year old male presented to the emergency department with swelling and pain in his left knee after a fall, x-rays were negative for fracture but his knee was fused so I was called for evaluation, I aspirated hemarthrosis from his knee, and injected. He did extremely well several days after the injection but unfortunately has started to develop severe pain, swelling, and warmth in his left knee. He is also having severe swelling and pain in his left leg and calf going down into the ankle and foot. No short of breath, no chest pain, no fevers or chills. No other constitutional symptoms.  Past medical history:  Negative.  See flowsheet/record as well for more information.  Surgical history: Negative.  See flowsheet/record as well for more information.  Family history: Negative.  See flowsheet/record as well for more information.  Social history: Negative.  See flowsheet/record as well for more information.  Allergies, and medications have been entered into the medical record, reviewed, and no changes needed.   Review of Systems: No fevers, chills, night sweats, weight loss, chest pain, or shortness of breath.   Objective:    General: Well Developed, well nourished, and in no acute distress.  Neuro: Alert and oriented x3, extra-ocular muscles intact, sensation grossly intact.  HEENT: Normocephalic, atraumatic, pupils equal round reactive to light, neck supple, no masses, no lymphadenopathy, thyroid nonpalpable.  Skin: Warm and dry, no rashes. Cardiac: Regular rate and rhythm, no murmurs rubs or gallops, no lower extremity edema.  Respiratory: Clear to auscultation bilaterally. Not using accessory muscles, speaking in full sentences. Left leg: Knee is minimally swollen, it is very warm to the touch, there is some pain with passive range of motion. His left leg and calf are swollen with a positive Homans sign, there is some  warmth as well as bruising. Left ankle is severely swollen, with tenderness over the lateral and medial malleoli, as well as dorsal midfoot. Pulses are good. Dorsalis pedis and posterior tibial, sensation grossly intact. He does have baseline 0/5 strength to extension at the knee.  Procedure: Real-time Ultrasound Guided aspiration of left knee Device: GE Logiq E  Verbal informed consent obtained.  Time-out conducted.  Noted no overlying erythema, induration, or other signs of local infection.  Skin prepped in a sterile fashion.  Local anesthesia: Topical Ethyl chloride.  With sterile technique and under real time ultrasound guidance:  Using an 18-gauge needle aspirated 2-3 mL somewhat serosanguineous fluid that was only minimally cloudy, no frank purulence. Completed without difficulty  Pain immediately resolved suggesting accurate placement of the medication.  Advised to call if fevers/chills, erythema, induration, drainage, or persistent bleeding.  Images permanently stored and available for review in the ultrasound unit.  Impression: Technically successful ultrasound guided injection.  Impression and Recommendations:    Primary osteoarthritis of both knees 6 days post aspiration of gross hemarthrosis with injection. Now with swelling, warmth, redness and severe pain, aspiration with stat cell counts, cultures. Considering persistence of pain as well and going to get a CT of his left knee, x-rays were negative but I do suspect occult fracture considering the severity of his pain.  Venous stasis dermatitis of both lower extremities Now with new acute left lower extremity swelling with a positive Homans sign. Stat DVT ultrasound.  Swelling of foot joint, left Also with severe pain and fusiform swelling of the left foot and ankle with severe tenderness  over the entire thing. X-rays of the foot and ankle.

## 2016-06-28 NOTE — Assessment & Plan Note (Signed)
Now with new acute left lower extremity swelling with a positive Homans sign. Stat DVT ultrasound.

## 2016-06-28 NOTE — Assessment & Plan Note (Signed)
Also with severe pain and fusiform swelling of the left foot and ankle with severe tenderness over the entire thing. X-rays of the foot and ankle.

## 2016-06-29 LAB — SYNOVIAL CELL COUNT + DIFF, W/ CRYSTALS
Basophils, %: 0 %
Eosinophils-Synovial: 5 % — ABNORMAL HIGH (ref 0–2)
Lymphocytes-Synovial Fld: 8 % (ref 0–74)
Monocyte/Macrophage: 2 % (ref 0–69)
Neutrophil, Synovial: 85 % — ABNORMAL HIGH (ref 0–24)
Synoviocytes, %: 0 % (ref 0–15)
WBC, Synovial: 515 {cells}/uL — ABNORMAL HIGH (ref <150)

## 2016-07-01 ENCOUNTER — Telehealth: Payer: Self-pay

## 2016-07-01 NOTE — Telephone Encounter (Signed)
Pt states he's having a pulling pain and swelling that still hasn't gone away and would like to know if there's anything topical he can use.

## 2016-07-01 NOTE — Telephone Encounter (Signed)
I think that ultimately we need to immobilize him, if he has a knee immobilizer/knee splint he should use it, if not he should come in a nurse visit to have one placed. Also he should ice it for 20 minutes 3-4 times per day.

## 2016-07-01 NOTE — Telephone Encounter (Signed)
Pt advised of recommendation, verbalized understanding. Pt does have a knee immobilizer at home, he will use it. No further questions.

## 2016-07-03 LAB — BODY FLUID CULTURE
Gram Stain: NONE SEEN
Gram Stain: NONE SEEN
Organism ID, Bacteria: NO GROWTH

## 2016-07-11 DIAGNOSIS — I2510 Atherosclerotic heart disease of native coronary artery without angina pectoris: Secondary | ICD-10-CM | POA: Diagnosis not present

## 2016-07-11 DIAGNOSIS — Z955 Presence of coronary angioplasty implant and graft: Secondary | ICD-10-CM | POA: Diagnosis not present

## 2016-07-11 DIAGNOSIS — E785 Hyperlipidemia, unspecified: Secondary | ICD-10-CM | POA: Diagnosis not present

## 2016-07-11 DIAGNOSIS — I10 Essential (primary) hypertension: Secondary | ICD-10-CM | POA: Diagnosis not present

## 2016-07-11 DIAGNOSIS — I739 Peripheral vascular disease, unspecified: Secondary | ICD-10-CM | POA: Diagnosis not present

## 2016-09-23 DIAGNOSIS — M9904 Segmental and somatic dysfunction of sacral region: Secondary | ICD-10-CM | POA: Diagnosis not present

## 2016-09-23 DIAGNOSIS — M5432 Sciatica, left side: Secondary | ICD-10-CM | POA: Diagnosis not present

## 2016-09-23 DIAGNOSIS — M9906 Segmental and somatic dysfunction of lower extremity: Secondary | ICD-10-CM | POA: Diagnosis not present

## 2016-09-23 DIAGNOSIS — M9903 Segmental and somatic dysfunction of lumbar region: Secondary | ICD-10-CM | POA: Diagnosis not present

## 2016-09-25 DIAGNOSIS — M9903 Segmental and somatic dysfunction of lumbar region: Secondary | ICD-10-CM | POA: Diagnosis not present

## 2016-09-25 DIAGNOSIS — M9904 Segmental and somatic dysfunction of sacral region: Secondary | ICD-10-CM | POA: Diagnosis not present

## 2016-09-25 DIAGNOSIS — M9906 Segmental and somatic dysfunction of lower extremity: Secondary | ICD-10-CM | POA: Diagnosis not present

## 2016-09-25 DIAGNOSIS — M5432 Sciatica, left side: Secondary | ICD-10-CM | POA: Diagnosis not present

## 2016-09-26 DIAGNOSIS — M9904 Segmental and somatic dysfunction of sacral region: Secondary | ICD-10-CM | POA: Diagnosis not present

## 2016-09-26 DIAGNOSIS — M5432 Sciatica, left side: Secondary | ICD-10-CM | POA: Diagnosis not present

## 2016-09-26 DIAGNOSIS — M9906 Segmental and somatic dysfunction of lower extremity: Secondary | ICD-10-CM | POA: Diagnosis not present

## 2016-09-26 DIAGNOSIS — M9903 Segmental and somatic dysfunction of lumbar region: Secondary | ICD-10-CM | POA: Diagnosis not present

## 2016-10-01 DIAGNOSIS — M5432 Sciatica, left side: Secondary | ICD-10-CM | POA: Diagnosis not present

## 2016-10-01 DIAGNOSIS — M9906 Segmental and somatic dysfunction of lower extremity: Secondary | ICD-10-CM | POA: Diagnosis not present

## 2016-10-01 DIAGNOSIS — M9904 Segmental and somatic dysfunction of sacral region: Secondary | ICD-10-CM | POA: Diagnosis not present

## 2016-10-01 DIAGNOSIS — M9903 Segmental and somatic dysfunction of lumbar region: Secondary | ICD-10-CM | POA: Diagnosis not present

## 2016-10-02 DIAGNOSIS — M9906 Segmental and somatic dysfunction of lower extremity: Secondary | ICD-10-CM | POA: Diagnosis not present

## 2016-10-02 DIAGNOSIS — M5432 Sciatica, left side: Secondary | ICD-10-CM | POA: Diagnosis not present

## 2016-10-02 DIAGNOSIS — M9904 Segmental and somatic dysfunction of sacral region: Secondary | ICD-10-CM | POA: Diagnosis not present

## 2016-10-02 DIAGNOSIS — M9903 Segmental and somatic dysfunction of lumbar region: Secondary | ICD-10-CM | POA: Diagnosis not present

## 2016-10-03 DIAGNOSIS — M9903 Segmental and somatic dysfunction of lumbar region: Secondary | ICD-10-CM | POA: Diagnosis not present

## 2016-10-03 DIAGNOSIS — M5432 Sciatica, left side: Secondary | ICD-10-CM | POA: Diagnosis not present

## 2016-10-03 DIAGNOSIS — M9904 Segmental and somatic dysfunction of sacral region: Secondary | ICD-10-CM | POA: Diagnosis not present

## 2016-10-03 DIAGNOSIS — M9906 Segmental and somatic dysfunction of lower extremity: Secondary | ICD-10-CM | POA: Diagnosis not present

## 2016-10-07 DIAGNOSIS — I2510 Atherosclerotic heart disease of native coronary artery without angina pectoris: Secondary | ICD-10-CM | POA: Diagnosis not present

## 2016-10-07 DIAGNOSIS — M9903 Segmental and somatic dysfunction of lumbar region: Secondary | ICD-10-CM | POA: Diagnosis not present

## 2016-10-07 DIAGNOSIS — M5432 Sciatica, left side: Secondary | ICD-10-CM | POA: Diagnosis not present

## 2016-10-07 DIAGNOSIS — M9904 Segmental and somatic dysfunction of sacral region: Secondary | ICD-10-CM | POA: Diagnosis not present

## 2016-10-07 DIAGNOSIS — M9906 Segmental and somatic dysfunction of lower extremity: Secondary | ICD-10-CM | POA: Diagnosis not present

## 2016-10-09 DIAGNOSIS — I10 Essential (primary) hypertension: Secondary | ICD-10-CM | POA: Diagnosis not present

## 2016-10-09 DIAGNOSIS — I2510 Atherosclerotic heart disease of native coronary artery without angina pectoris: Secondary | ICD-10-CM | POA: Diagnosis not present

## 2016-10-09 DIAGNOSIS — M9904 Segmental and somatic dysfunction of sacral region: Secondary | ICD-10-CM | POA: Diagnosis not present

## 2016-10-09 DIAGNOSIS — M5432 Sciatica, left side: Secondary | ICD-10-CM | POA: Diagnosis not present

## 2016-10-09 DIAGNOSIS — M9903 Segmental and somatic dysfunction of lumbar region: Secondary | ICD-10-CM | POA: Diagnosis not present

## 2016-10-09 DIAGNOSIS — E0590 Thyrotoxicosis, unspecified without thyrotoxic crisis or storm: Secondary | ICD-10-CM | POA: Diagnosis not present

## 2016-10-09 DIAGNOSIS — M9906 Segmental and somatic dysfunction of lower extremity: Secondary | ICD-10-CM | POA: Diagnosis not present

## 2016-10-10 DIAGNOSIS — M5432 Sciatica, left side: Secondary | ICD-10-CM | POA: Diagnosis not present

## 2016-10-10 DIAGNOSIS — M9903 Segmental and somatic dysfunction of lumbar region: Secondary | ICD-10-CM | POA: Diagnosis not present

## 2016-10-10 DIAGNOSIS — M9904 Segmental and somatic dysfunction of sacral region: Secondary | ICD-10-CM | POA: Diagnosis not present

## 2016-10-10 DIAGNOSIS — M9906 Segmental and somatic dysfunction of lower extremity: Secondary | ICD-10-CM | POA: Diagnosis not present

## 2016-10-14 DIAGNOSIS — M5432 Sciatica, left side: Secondary | ICD-10-CM | POA: Diagnosis not present

## 2016-10-14 DIAGNOSIS — M9906 Segmental and somatic dysfunction of lower extremity: Secondary | ICD-10-CM | POA: Diagnosis not present

## 2016-10-14 DIAGNOSIS — M9903 Segmental and somatic dysfunction of lumbar region: Secondary | ICD-10-CM | POA: Diagnosis not present

## 2016-10-14 DIAGNOSIS — M9904 Segmental and somatic dysfunction of sacral region: Secondary | ICD-10-CM | POA: Diagnosis not present

## 2016-10-15 DIAGNOSIS — M9906 Segmental and somatic dysfunction of lower extremity: Secondary | ICD-10-CM | POA: Diagnosis not present

## 2016-10-15 DIAGNOSIS — M9904 Segmental and somatic dysfunction of sacral region: Secondary | ICD-10-CM | POA: Diagnosis not present

## 2016-10-15 DIAGNOSIS — M5432 Sciatica, left side: Secondary | ICD-10-CM | POA: Diagnosis not present

## 2016-10-15 DIAGNOSIS — M9903 Segmental and somatic dysfunction of lumbar region: Secondary | ICD-10-CM | POA: Diagnosis not present

## 2016-10-16 DIAGNOSIS — M9903 Segmental and somatic dysfunction of lumbar region: Secondary | ICD-10-CM | POA: Diagnosis not present

## 2016-10-16 DIAGNOSIS — M9904 Segmental and somatic dysfunction of sacral region: Secondary | ICD-10-CM | POA: Diagnosis not present

## 2016-10-16 DIAGNOSIS — M9906 Segmental and somatic dysfunction of lower extremity: Secondary | ICD-10-CM | POA: Diagnosis not present

## 2016-10-16 DIAGNOSIS — M5432 Sciatica, left side: Secondary | ICD-10-CM | POA: Diagnosis not present

## 2016-10-17 DIAGNOSIS — M5432 Sciatica, left side: Secondary | ICD-10-CM | POA: Diagnosis not present

## 2016-10-17 DIAGNOSIS — M9906 Segmental and somatic dysfunction of lower extremity: Secondary | ICD-10-CM | POA: Diagnosis not present

## 2016-10-17 DIAGNOSIS — M9904 Segmental and somatic dysfunction of sacral region: Secondary | ICD-10-CM | POA: Diagnosis not present

## 2016-10-17 DIAGNOSIS — M9903 Segmental and somatic dysfunction of lumbar region: Secondary | ICD-10-CM | POA: Diagnosis not present

## 2016-10-21 ENCOUNTER — Encounter: Payer: Self-pay | Admitting: Family Medicine

## 2016-10-21 ENCOUNTER — Ambulatory Visit (INDEPENDENT_AMBULATORY_CARE_PROVIDER_SITE_OTHER): Payer: Medicare Other | Admitting: Family Medicine

## 2016-10-21 VITALS — BP 138/72 | HR 62 | Ht 73.0 in | Wt 177.0 lb

## 2016-10-21 DIAGNOSIS — R4586 Emotional lability: Secondary | ICD-10-CM

## 2016-10-21 DIAGNOSIS — M5432 Sciatica, left side: Secondary | ICD-10-CM | POA: Diagnosis not present

## 2016-10-21 DIAGNOSIS — F39 Unspecified mood [affective] disorder: Secondary | ICD-10-CM

## 2016-10-21 DIAGNOSIS — M9906 Segmental and somatic dysfunction of lower extremity: Secondary | ICD-10-CM | POA: Diagnosis not present

## 2016-10-21 DIAGNOSIS — Z23 Encounter for immunization: Secondary | ICD-10-CM

## 2016-10-21 DIAGNOSIS — I10 Essential (primary) hypertension: Secondary | ICD-10-CM

## 2016-10-21 DIAGNOSIS — R7301 Impaired fasting glucose: Secondary | ICD-10-CM

## 2016-10-21 DIAGNOSIS — M9904 Segmental and somatic dysfunction of sacral region: Secondary | ICD-10-CM | POA: Diagnosis not present

## 2016-10-21 DIAGNOSIS — M9903 Segmental and somatic dysfunction of lumbar region: Secondary | ICD-10-CM | POA: Diagnosis not present

## 2016-10-21 LAB — POCT GLYCOSYLATED HEMOGLOBIN (HGB A1C): Hemoglobin A1C: 6.8

## 2016-10-21 MED ORDER — AMBULATORY NON FORMULARY MEDICATION: 1 refills | Status: DC
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
## 2016-10-22 ENCOUNTER — Telehealth: Payer: Self-pay

## 2016-10-22 DIAGNOSIS — M9906 Segmental and somatic dysfunction of lower extremity: Secondary | ICD-10-CM | POA: Diagnosis not present

## 2016-10-22 DIAGNOSIS — R7301 Impaired fasting glucose: Secondary | ICD-10-CM

## 2016-10-22 DIAGNOSIS — M9904 Segmental and somatic dysfunction of sacral region: Secondary | ICD-10-CM | POA: Diagnosis not present

## 2016-10-22 DIAGNOSIS — M9903 Segmental and somatic dysfunction of lumbar region: Secondary | ICD-10-CM | POA: Diagnosis not present

## 2016-10-22 DIAGNOSIS — M5432 Sciatica, left side: Secondary | ICD-10-CM | POA: Diagnosis not present

## 2016-10-22 NOTE — Telephone Encounter (Signed)
Pt's wife called and would like a referral to a dietician for help with diet for diabetes.  Please advise.

## 2016-10-22 NOTE — Telephone Encounter (Signed)
OK for nutrtion referral.

## 2016-10-22 NOTE — Telephone Encounter (Signed)
Referral placed.Russell Neal Lynetta  

## 2016-11-06 DIAGNOSIS — D485 Neoplasm of uncertain behavior of skin: Secondary | ICD-10-CM | POA: Diagnosis not present

## 2016-11-06 DIAGNOSIS — Z08 Encounter for follow-up examination after completed treatment for malignant neoplasm: Secondary | ICD-10-CM | POA: Diagnosis not present

## 2016-11-06 DIAGNOSIS — Z85820 Personal history of malignant melanoma of skin: Secondary | ICD-10-CM | POA: Diagnosis not present

## 2016-11-06 DIAGNOSIS — L570 Actinic keratosis: Secondary | ICD-10-CM | POA: Diagnosis not present

## 2016-12-30 ENCOUNTER — Telehealth: Payer: Self-pay | Admitting: Family Medicine

## 2016-12-30 MED ORDER — SERTRALINE HCL 50 MG PO TABS: 50.0000 mg | ORAL_TABLET | Freq: Every day | ORAL | 1 refills | Status: DC

## 2016-12-30 NOTE — Telephone Encounter (Signed)
Called and spoke w/pt and advised him that sertraline was changed back to 50 mg and that per his last OV Dr. Madilyn Fireman advised that he increased this to 50 mg. Maryruth Eve, [place] Crocker'

## 2016-12-30 NOTE — Telephone Encounter (Signed)
Patient's wife called for refill of sertraline. She stated that patient takes 50mg  tablets; however, we are showing 25mg  tablets in his chart. Please advise. Please call patient's cell phone if wife does not answer home phone: [phone]. Thanks!

## 2017-01-16 ENCOUNTER — Encounter: Payer: Self-pay | Admitting: Family Medicine

## 2017-01-16 ENCOUNTER — Ambulatory Visit (INDEPENDENT_AMBULATORY_CARE_PROVIDER_SITE_OTHER): Payer: Medicare Other | Admitting: Family Medicine

## 2017-01-16 VITALS — BP 139/86 | HR 64 | Wt 164.0 lb

## 2017-01-16 DIAGNOSIS — I10 Essential (primary) hypertension: Secondary | ICD-10-CM | POA: Diagnosis not present

## 2017-01-16 DIAGNOSIS — Z23 Encounter for immunization: Secondary | ICD-10-CM | POA: Diagnosis not present

## 2017-01-16 DIAGNOSIS — R7301 Impaired fasting glucose: Secondary | ICD-10-CM | POA: Diagnosis not present

## 2017-01-16 DIAGNOSIS — E7800 Pure hypercholesterolemia, unspecified: Secondary | ICD-10-CM | POA: Diagnosis not present

## 2017-01-16 LAB — POCT GLYCOSYLATED HEMOGLOBIN (HGB A1C): Hemoglobin A1C: 5.4

## 2017-01-16 NOTE — Progress Notes (Signed)
Subjective:    CC: BP and glucose, 3 mo f/u   HPI:  Hypertension- Pt denies chest pain, SOB, dizziness, or heart palpitations.  Taking meds as directed w/o problems.  Denies medication side effects.    Impaired fasting glucose-no increased thirst or urination. No symptoms consistent with hypoglycemia.  Last hemoglobin A1c was 6.8 which was actually the diabetic range.  He really wanted 3 months to work seriously on diet and exercise to see if he could bring this back down.  Not want to start medication at that time.  Hyperlipidemia-his cardiologist, Dr. Katherine Mantle would really like to put him on probably went since he is intolerant to statins.  I did update his medication list because he has also in fact tried simvastatin as well as atorvastatin in the past.  He and his wife had some questions.  They both very fearful about him starting a new medication as the statins caused significant weakness of his lower extremities, which caused significant impairment for months.   Past medical history, Surgical history, Family history not pertinant except as noted below, Social history, Allergies, and medications have been entered into the medical record, reviewed, and corrections made.   Review of Systems: No fevers, chills, night sweats, weight loss, chest pain, or shortness of breath.   Objective:    General: Well Developed, well nourished, and in no acute distress.  Neuro: Alert and oriented x3, extra-ocular muscles intact, sensation grossly intact.  HEENT: Normocephalic, atraumatic  Skin: Warm and dry, no rashes. Cardiac: Regular rate and rhythm, no murmurs rubs or gallops, no lower extremity edema.  Respiratory: Clear to auscultation bilaterally. Not using accessory muscles, speaking in full sentences.   Impression and Recommendations:    HTN  - Well controlled. Continue current regimen. Follow up in  6 months.    IFG - .Well controlled. Continue current regimen. Follow up in  6 months.     Hyperlipidemia -we had a discussion today about the statin intolerance as well as the probably went.  Specifically I did look the medication up and there is a 4% report of myalgias and 3% of muscle spasms.  This is significantly lower than most statins.  We also discussed that if he tries the medication starts to experience any symptoms that can be stopped and most of the symptoms are reversible.  It is quite pensive so they would want to make sure that it is covered by his current Medicare plan.  Prevnar 13 vaccine provided today.

## 2017-01-23 ENCOUNTER — Ambulatory Visit: Payer: Medicare Other | Admitting: Family Medicine

## 2017-02-12 ENCOUNTER — Telehealth: Payer: Self-pay | Admitting: Family Medicine

## 2017-02-12 NOTE — Telephone Encounter (Signed)
Pt's wife called and stated that the patient has received both shingle shots from the [facility] on [address] in [place]. Thanks

## 2017-02-12 NOTE — Telephone Encounter (Signed)
Chart updated

## 2017-03-17 DIAGNOSIS — H2513 Age-related nuclear cataract, bilateral: Secondary | ICD-10-CM | POA: Diagnosis not present

## 2017-03-17 DIAGNOSIS — H5203 Hypermetropia, bilateral: Secondary | ICD-10-CM | POA: Diagnosis not present

## 2017-03-17 DIAGNOSIS — H43393 Other vitreous opacities, bilateral: Secondary | ICD-10-CM | POA: Diagnosis not present

## 2017-04-09 DIAGNOSIS — E0590 Thyrotoxicosis, unspecified without thyrotoxic crisis or storm: Secondary | ICD-10-CM | POA: Diagnosis not present
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
## 2017-06-02 ENCOUNTER — Ambulatory Visit (INDEPENDENT_AMBULATORY_CARE_PROVIDER_SITE_OTHER): Payer: Medicare Other | Admitting: Family Medicine

## 2017-06-02 ENCOUNTER — Encounter: Payer: Self-pay | Admitting: Family Medicine

## 2017-06-02 ENCOUNTER — Telehealth: Payer: Self-pay

## 2017-06-02 VITALS — BP 128/89 | HR 66 | Ht 73.0 in | Wt 163.0 lb

## 2017-06-02 DIAGNOSIS — R7301 Impaired fasting glucose: Secondary | ICD-10-CM | POA: Diagnosis not present

## 2017-06-02 DIAGNOSIS — L570 Actinic keratosis: Secondary | ICD-10-CM

## 2017-06-02 DIAGNOSIS — K5909 Other constipation: Secondary | ICD-10-CM | POA: Diagnosis not present

## 2017-06-02 LAB — POCT GLYCOSYLATED HEMOGLOBIN (HGB A1C): Hemoglobin A1C: 5.5

## 2017-06-02 NOTE — Progress Notes (Addendum)
Subjective:    Patient ID: Russell Neal, male    DOB: Sep 09, 1940, 77 y.o.   MRN: [id number]  HPI 77 year old male comes in today with several skin lesions on his face on his left posterior forearm.  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  Impaired fasting glucose-no increased thirst or urination. No symptoms consistent with hypoglycemia.  He is now on Repatha for his cholesterol.  He believes he is on his either second or third injection and so far has been doing well.  He will have a follow-up in July to recheck labs.  He also wanted to discuss chronic constipation.  He says is been a problem for years and years.  Even before he started medication though he does feel like some of his medicines worsen it a little bit.  He more recently found a supplement that his daughter gave him which she found online which seems to actually be working really well for him to move his bowels.  It is tried multiple over-the-counter drugs in the past with only so-so results.  Review of Systems  BP 128/89   Pulse 66   Ht 6\' 1"  (1.854 m)   Wt 163 lb (73.9 kg)   SpO2 98%   BMI 21.51 kg/m     Allergies  Allergen Reactions  . Atorvastatin Other (See Comments)    Muscle weakness  . Simvastatin Other (See Comments)    Muscle weakness    Past Medical History:  Diagnosis Date  . Hyperlipidemia   . Hypertension     History reviewed. No pertinent surgical history.  Social History   Socioeconomic History  . Marital status: Married    Spouse name: Not on file  . Number of children: Not on file  . Years of education: Not on file  . Highest education level: Not on file  Occupational History  . Not on file  Social Needs  . Financial resource strain: Not on file  . Food insecurity:    Worry: Not on file    Inability: Not on file  . Transportation needs:    Medical: Not on file    Non-medical: Not on file  Tobacco Use  . Smoking status: Former Smoker     Types: Cigarettes    Last attempt to quit: 02/18/1973    Years since quitting: 44.3  . Smokeless tobacco: Never Used  Substance and Sexual Activity  . Alcohol use: Yes  . Drug use: Not on file  . Sexual activity: Not on file  Lifestyle  . Physical activity:    Days per week: Not on file    Minutes per session: Not on file  . Stress: Not on file  Relationships  . Social connections:    Talks on phone: Not on file    Gets together: Not on file    Attends religious service: Not on file    Active member of club or organization: Not on file    Attends meetings of clubs or organizations: Not on file    Relationship status: Not on file  . Intimate partner violence:    Fear of current or ex partner: Not on file    Emotionally abused: Not on file    Physically abused: Not on file    Forced sexual activity: Not on file  Other Topics Concern  . Not on file  Social History Narrative  . Not on file  History reviewed. No pertinent family history.  Outpatient Encounter Medications as of 06/02/2017  Medication Sig  . AMBULATORY NON FORMULARY MEDICATION Medication Name:  shigrix IM x 1. Repeat in 2-6 months.  [name] aspirin 81 MG tablet Take 81 mg by mouth daily.  . Cyanocobalamin (B-12 PO) Take by mouth.  . Evolocumab (REPATHA) 140 MG/ML SOSY Inject 140 mg into the skin every 14 (fourteen) days.  . methimazole (TAPAZOLE) 5 MG tablet TAKE ONE-HALF TABLET BY MOUTH ONCE DAILY  . Misc. Devices (ROLLER WALKER) MISC 4 point rolling walker  . Multiple Vitamins-Minerals (CENTRUM SILVER PO) Take 1 capsule by mouth daily.  . Omega-3 Fatty Acids (FISH OIL) 1000 MG CAPS Take 2 capsules by mouth daily.  . sertraline (ZOLOFT) 50 MG tablet Take 1 tablet (50 mg total) by mouth daily.   No facility-administered encounter medications on file as of 06/02/2017.          Objective:   Physical Exam  Constitutional: He is oriented to person, place, and time. He appears well-developed and well-nourished.   HENT:  Head: Normocephalic and atraumatic.  Eyes: Conjunctivae and EOM are normal.  Cardiovascular: Normal rate.  Pulmonary/Chest: Effort normal.  Neurological: He is alert and oriented to person, place, and time.  Skin: Skin is dry. No pallor.  He has several small actinic keratoses particularly on the forehead and upper cheeks and on the outer ears.  And a couple of seborrheic keratoses.  Psychiatric: He has a normal mood and affect. His behavior is normal.  Vitals reviewed.         Assessment & Plan:  Actinic keratoses/seborrheic keratoses -cryotherapy performed.  Patient tolerated it well.  Skin should pill within 1 to 2 weeks.  Can just apply a small dab of Vaseline if needed.  Impaired fasting glucose-hemoglobin A1c looks absolutely fantastic at 5.5.  Doing well.  Recommend repeat in 1 year.  Chronic constipation-suspect that he probably has slow transit.  Especially since this is been going on for years.  We discussed that it really the most effective thing is for him to be on something regularly instead of waiting until he is very backed up to take something and try to get relief and then he does better for a while and then gets backed up again he really needs to take something actively.  It sounds like he is taking some type of natural stimulant but it probably is still a stimulant laxative.  It is okay to use these fairly regularly.  Also discussed MiraLAX as an osmotic laxative that is safe to take on a regular basis as well.  Gust the importance of increasing fiber in the diet.  Cryotherapy Procedure Note  Pre-operative Diagnosis: Actinic keratosis  Post-operative Diagnosis: Actinic keratosis, and a few   Locations: Forehead, upper cheeks and outer ears bilaterally  Indications: Pre cancerous lesions  Anesthesia: not required    Procedure Details  Patient informed of risks (permanent scarring, infection, light or dark discoloration, bleeding, infection, weakness,  numbness and recurrence of the lesion) and benefits of the procedure and verbal informed consent obtained.  The areas are treated with liquid nitrogen therapy, frozen until ice ball extended 1-2 mm beyond lesion, allowed to thaw, and treated again. The patient tolerated procedure well.  The patient was instructed on post-op care, warned that there may be blister formation, redness and pain. Recommend OTC analgesia as needed for pain.  Condition: Stable  Complications: none.  Plan: 1. Instructed to keep the area dry  and covered for 24-48h and clean thereafter. 2. Warning signs of infection were reviewed.   3. Recommended that the patient use OTC acetaminophen as needed for pain.  4. Return PRN.

## 2017-06-02 NOTE — Telephone Encounter (Signed)
I see no issue with those.

## 2017-06-02 NOTE — Telephone Encounter (Signed)
Patient's daughter called and states Janathan has been on a supplement, for constipation, it is  Called Intestinal Formula #1. She wants to make sure it is ok to take with his other medications. She reports it is working well.         BOTANICAL INGREDIENTS: [place] and Wild Harvested [place] Aloe Leaf, Organic Senna Leaf and Pod, Wild Harvested [place] Aged [place], Wild Harvested [place] Grape [place], Organic [place] Yellow Ginger Root, Organic Garlic Bulb, Wild Harvested [place] Pepper  Other ingredients: Cellulose (vegetable plant fiber capsule)

## 2017-06-03 NOTE — Telephone Encounter (Signed)
Patient advised of recommendations.  

## 2017-06-24 ENCOUNTER — Other Ambulatory Visit: Payer: Self-pay | Admitting: Family Medicine

## 2017-07-17 ENCOUNTER — Encounter: Payer: Self-pay | Admitting: Family Medicine

## 2017-07-17 ENCOUNTER — Ambulatory Visit (INDEPENDENT_AMBULATORY_CARE_PROVIDER_SITE_OTHER): Payer: Medicare Other | Admitting: Family Medicine

## 2017-07-17 VITALS — BP 113/72 | HR 82 | Ht 73.0 in | Wt 166.0 lb

## 2017-07-17 DIAGNOSIS — I2510 Atherosclerotic heart disease of native coronary artery without angina pectoris: Secondary | ICD-10-CM | POA: Diagnosis not present

## 2017-07-17 DIAGNOSIS — M6281 Muscle weakness (generalized): Secondary | ICD-10-CM | POA: Diagnosis not present

## 2017-07-17 DIAGNOSIS — I10 Essential (primary) hypertension: Secondary | ICD-10-CM | POA: Diagnosis not present

## 2017-07-17 DIAGNOSIS — R4586 Emotional lability: Secondary | ICD-10-CM

## 2017-07-17 DIAGNOSIS — I739 Peripheral vascular disease, unspecified: Secondary | ICD-10-CM | POA: Diagnosis not present

## 2017-07-17 NOTE — Progress Notes (Signed)
Subjective:    CC: HTN   HPI:  Hypertension- Pt denies chest pain, SOB, dizziness, or heart palpitations.  Taking meds as directed w/o problems.  Denies medication side effects.    CAD - He still on the Repatha for his lipids with Dr. Mauricio Po.  He has been tolerating it well without any side effects.  He did want to know if he should continue the Zetia or not.  Follow-up mood-we have had him on Zoloft for a little over a year.  He is doing okay overall.  He says his wife is been a little bit irritable for him and that is been a little difficult.  Right now he wants to stay with 50 mg but says he might call me back for an adjustment at some point if he feels he needs to go up.  Objective:    General: Well Developed, well nourished, and in no acute distress.  Neuro: Alert and oriented x3, extra-ocular muscles intact, sensation grossly intact.  HEENT: Normocephalic, atraumatic  Skin: Warm and dry, no rashes. Cardiac: Regular rate and rhythm, no murmurs rubs or gallops, no lower extremity edema.  Respiratory: Clear to auscultation bilaterally. Not using accessory muscles, speaking in full sentences.   Impression and Recommendations:   HTN -Controlled on current regimen. F/U in 6 month.   Coronary artery disease with hyperlipidemia and peripheral vascular disease-doing well on Repatha.  Has a follow-up in July.  Did encourage him to discuss with Dr. Mauricio Po whether or not to continue the Zetia.  The Repatha is much more powerful and is that he at this point probably is not contributing a lot and is just costing him extra money.  Mood change -new current regimen of Zoloft.  Though encouraged him to call me at any point if he feels that we need to increase his dose.

## 2017-07-22 ENCOUNTER — Ambulatory Visit (INDEPENDENT_AMBULATORY_CARE_PROVIDER_SITE_OTHER): Payer: Medicare Other | Admitting: Family Medicine

## 2017-07-22 ENCOUNTER — Encounter: Payer: Self-pay | Admitting: Family Medicine

## 2017-07-22 VITALS — BP 146/86 | HR 60 | Ht 71.0 in | Wt 163.0 lb

## 2017-07-22 DIAGNOSIS — I2510 Atherosclerotic heart disease of native coronary artery without angina pectoris: Secondary | ICD-10-CM

## 2017-07-22 DIAGNOSIS — I872 Venous insufficiency (chronic) (peripheral): Secondary | ICD-10-CM | POA: Diagnosis not present

## 2017-07-22 MED ORDER — TRIAMCINOLONE 0.1 % CREAM:EUCERIN CREAM 1:1: 1.0000 "application " | TOPICAL_CREAM | Freq: Two times a day (BID) | CUTANEOUS | 1 refills | Status: DC

## 2017-07-22 NOTE — Progress Notes (Signed)
Subjective:    Patient ID: Russell Neal, male    DOB: 1940-02-21, 77 y.o.   MRN: [id number]  HPI pt reports that he forgot to mention when he was here on 6/20 about his lower leg and ankle swelling. he does wear compression stockings daily. he did not wear them today he does try to elevate his legs. past 2 days the swelling has gotten worse as well as some redness and scaling. He did say that the stockings that he had on were a lower grade. legs are red and itchy using vaseline and cerve for this. Denies any sig dietary changes.  No fevers chills or sweats.  His wife questions whether or not it could be coming from the [place] or not.  I started this a couple of months ago and are continuing to take this area as well.   Review of Systems  BP (!) 146/86   Pulse 60   Ht 5\' 11"  (1.803 m)   Wt 163 lb (73.9 kg)   SpO2 99%   BMI 22.73 kg/m     Allergies  Allergen Reactions  . Atorvastatin Other (See Comments)    Muscle weakness  . Simvastatin Other (See Comments)    Muscle weakness    Past Medical History:  Diagnosis Date  . Hyperlipidemia   . Hypertension     No past surgical history on file.  Social History   Socioeconomic History  . Marital status: Married    Spouse name: Not on file  . Number of children: Not on file  . Years of education: Not on file  . Highest education level: Not on file  Occupational History  . Not on file  Social Needs  . Financial resource strain: Not on file  . Food insecurity:    Worry: Not on file    Inability: Not on file  . Transportation needs:    Medical: Not on file    Non-medical: Not on file  Tobacco Use  . Smoking status: Former Smoker    Types: Cigarettes    Last attempt to quit: 02/18/1973    Years since quitting: 44.4  . Smokeless tobacco: Never Used  Substance and Sexual Activity  . Alcohol use: Yes  . Drug use: Not on file  . Sexual activity: Not on file  Lifestyle  . Physical activity:    Days per week: Not on file      Minutes per session: Not on file  . Stress: Not on file  Relationships  . Social connections:    Talks on phone: Not on file    Gets together: Not on file    Attends religious service: Not on file    Active member of club or organization: Not on file    Attends meetings of clubs or organizations: Not on file    Relationship status: Not on file  . Intimate partner violence:    Fear of current or ex partner: Not on file    Emotionally abused: Not on file    Physically abused: Not on file    Forced sexual activity: Not on file  Other Topics Concern  . Not on file  Social History Narrative  . Not on file    No family history on file.  Outpatient Encounter Medications as of 07/22/2017  Medication Sig  . aspirin 81 MG tablet Take 81 mg by mouth daily.  . Cyanocobalamin (B-12 PO) Take by mouth.  . Evolocumab (REPATHA) 140 MG/ML SOSY Inject  140 mg into the skin every 14 (fourteen) days.  [name] ezetimibe (ZETIA) 10 MG tablet Take 10 mg by mouth daily.  . methimazole (TAPAZOLE) 5 MG tablet TAKE ONE-HALF TABLET BY MOUTH ONCE DAILY  . Multiple Vitamins-Minerals (CENTRUM SILVER PO) Take 1 capsule by mouth daily.  . Omega-3 Fatty Acids (FISH OIL) 1000 MG CAPS Take 2 capsules by mouth daily.  . sertraline (ZOLOFT) 50 MG tablet TAKE 1 TABLET BY MOUTH ONCE DAILY  . Triamcinolone Acetonide (TRIAMCINOLONE 0.1 % CREAM : EUCERIN) CREA Apply 1 application topically 2 (two) times daily. don't use more than 2 weeks at a time.   No facility-administered encounter medications on file as of 07/22/2017.          Objective:   Physical Exam  Constitutional: He is oriented to person, place, and time. He appears well-developed and well-nourished.  HENT:  Head: Normocephalic and atraumatic.  Eyes: Conjunctivae and EOM are normal.  Cardiovascular: Normal rate.  Pulmonary/Chest: Effort normal.  Neurological: He is alert and oriented to person, place, and time.  Skin: Skin is dry. No pallor.   Psychiatric: He has a normal mood and affect. His behavior is normal.  Vitals reviewed.           Assessment & Plan:  Venous stasis dermatitis-no sign of infection at this point.  Will treat with a compounded Eucerin and triamcinolone cream.  Can apply once or twice daily.  If no significant improvement after 1 week please let me know.  Discussed the importance of getting the skin a break after 2 weeks to avoid thinning and discoloration.  Continue to wear compression stockings during the daytime.  Try to elevate feet 10 or 15 minutes twice a day if able.  Avoid excess sodium in the diet start paying attention to labels.

## 2017-08-05 DIAGNOSIS — Z955 Presence of coronary angioplasty implant and graft: Secondary | ICD-10-CM | POA: Diagnosis not present

## 2017-08-05 DIAGNOSIS — I2510 Atherosclerotic heart disease of native coronary artery without angina pectoris: Secondary | ICD-10-CM | POA: Diagnosis not present

## 2017-08-05 DIAGNOSIS — I10 Essential (primary) hypertension: Secondary | ICD-10-CM | POA: Diagnosis not present

## 2017-08-05 DIAGNOSIS — E7800 Pure hypercholesterolemia, unspecified: Secondary | ICD-10-CM | POA: Diagnosis not present

## 2017-08-19 ENCOUNTER — Ambulatory Visit (INDEPENDENT_AMBULATORY_CARE_PROVIDER_SITE_OTHER): Payer: Medicare Other | Admitting: Physician Assistant

## 2017-08-19 ENCOUNTER — Encounter: Payer: Self-pay | Admitting: Physician Assistant

## 2017-08-19 VITALS — BP 159/88 | HR 59 | Temp 97.7°F | Wt 165.0 lb

## 2017-08-19 DIAGNOSIS — I872 Venous insufficiency (chronic) (peripheral): Secondary | ICD-10-CM

## 2017-08-19 DIAGNOSIS — I2510 Atherosclerotic heart disease of native coronary artery without angina pectoris: Secondary | ICD-10-CM | POA: Diagnosis not present

## 2017-08-19 NOTE — Progress Notes (Signed)
HPI:                                                                Russell Neal is a 77 y.o. male who presents to [facility]: [facility] today for leg rash  Chronic, recurrent bilateral rash of lower extremities. Rash is scaly and pruritic. He has been applying Triamcinolone without improvement. Associated with lower extremity swelling, worse on the left side. He endorses bilateral numbness/tingling in his feet. Denies claudication, calf tenderness. Denies ulceration/wounds.  Also reports cardiologist has stopped his Zetia.  Depression screen [facility] 2/9 07/17/2017 07/17/2017 10/21/2016 04/19/2016  Decreased Interest 1 0 1 0  Down, Depressed, Hopeless 1 1 1  0  PHQ - 2 Score 2 1 2  0  Altered sleeping 0 - 0 -  Tired, decreased energy 0 - 1 -  Change in appetite 0 - 0 -  Feeling bad or failure about yourself  0 - 0 -  Trouble concentrating 0 - 0 -  Moving slowly or fidgety/restless 0 - 0 -  Suicidal thoughts 0 - 0 -  PHQ-9 Score 2 - 3 -  Difficult doing work/chores Not difficult at all - - -    No flowsheet data found.    Past Medical History:  Diagnosis Date  . Hyperlipidemia   . Hypertension    No past surgical history on file. Social History   Tobacco Use  . Smoking status: Former Smoker    Types: Cigarettes    Last attempt to quit: 02/18/1973    Years since quitting: 44.5  . Smokeless tobacco: Never Used  Substance Use Topics  . Alcohol use: Yes   family history is not on file.    ROS: negative except as noted in the HPI  Medications: Current Outpatient Medications  Medication Sig Dispense Refill  . aspirin 81 MG tablet Take 81 mg by mouth daily.    . Cyanocobalamin (B-12 PO) Take by mouth.    . Evolocumab (REPATHA) 140 MG/ML SOSY Inject 140 mg into the skin every 14 (fourteen) days.    [name] ezetimibe (ZETIA) 10 MG tablet Take 10 mg by mouth daily.  5  . methimazole (TAPAZOLE) 5 MG tablet TAKE ONE-HALF TABLET BY MOUTH ONCE  DAILY    . Multiple Vitamins-Minerals (CENTRUM SILVER PO) Take 1 capsule by mouth daily.    . Omega-3 Fatty Acids (FISH OIL) 1000 MG CAPS Take 2 capsules by mouth daily.    . sertraline (ZOLOFT) 50 MG tablet TAKE 1 TABLET BY MOUTH ONCE DAILY 90 tablet 1  . Triamcinolone Acetonide (TRIAMCINOLONE 0.1 % CREAM : EUCERIN) CREA Apply 1 application topically 2 (two) times daily. don't use more than 2 weeks at a time. 100 each 1   No current facility-administered medications for this visit.    Allergies  Allergen Reactions  . Atorvastatin Other (See Comments)    Muscle weakness  . Simvastatin Other (See Comments)    Muscle weakness       Objective:  BP (!) 159/88   Pulse (!) 59   Temp 97.7 F (36.5 C) (Oral)   Wt 165 lb (74.8 kg)   BMI 23.01 kg/m  Gen:  alert, not ill-appearing, no distress, appropriate for age HEENT: head normocephalic without  obvious abnormality, conjunctiva and cornea clear, trachea midline Pulm: Normal work of breathing, normal phonation Neuro: alert and oriented x 3, no tremor MSK: extremities atraumatic, normal gait and station, 2+ peripheral edema to the mid-calf, no calf tenderness Skin: bilateral lower extremities are ruddy in appearance with scaly rash with mild skin breakdown of the medial distal left lower extremity, delayed capillary refill bilaterally    No results found for this or any previous visit (from the past 72 hour(s)). No results found.    Assessment and Plan: 77 y.o. male with   Venous stasis dermatitis of both lower extremities - d/c Triamcinolone - concern for early venous stasis ulcer development - patient placed in bilateral unna boot today    Patient education and anticipatory guidance given Patient agrees with treatment plan Follow-up in 1 week for unna boot change or sooner as needed if symptoms worsen or fail to improve  Darlyne Russian PA-C

## 2017-08-19 NOTE — Patient Instructions (Signed)
Venous Ulcer A venous ulcer is a shallow sore on your lower leg. It is caused by poor circulation in your veins. Venous ulcer is the most common type of lower leg ulcer. You may have venous ulcers on one leg or on both legs. This condition most often develops around your ankles. This type of ulcer may last for a long time (chronic ulcer) or it may return often (recurrent ulcer). Follow these instructions at home: Wound care  Follow instructions from your doctor about: ? How to take care of your wound. ? When and how you should change your bandage (dressing). ? When you should remove your bandage. If your bandage is dry and gets stuck to your leg when you try to remove it, moisten or wet the bandage with saline solution or water. This helps you to remove it without harming your skin or wound.  Check your wound every day for signs of infection. Have a caregiver do this for you if you are not able to do it yourself. Watch for: ? More redness, swelling, or pain. ? More fluid or blood. ? Pus, warmth, or a bad smell. Medicines  Take over-the-counter and prescription medicines only as told by your doctor.  If you were prescribed an antibiotic medicine, take it or apply it as told by your doctor. Do not stop taking or using the antibiotic even if your condition improves. Activity  Do not stand or sit in one position for a long period of time. Rest with your legs raised during the day. If possible, keep your legs above your heart for 30 minutes, 3-4 times a day, or as told by your doctor.  Do not sit with your legs crossed.  Walk often to increase the blood flow in your legs. Ask your doctor what level of activity is safe for you.  If you are taking a long ride in a car or plane, take a break to walk around at least once every two hours, or as told by your doctor. Ask your doctor if you should take aspirin before long trips. General instructions   Wear elastic stockings, compression stockings,  or support hose as told by your doctor. This is very important.  Raise the foot of your bed as told by your doctor.  Do not smoke.  Keep all follow-up visits as told by your doctor. This is important. Contact a doctor if:  You have a fever.  Your ulcer is getting larger or is not healing.  Your pain gets worse.  You have more redness or swelling around your ulcer.  You have more fluid, blood, or pus coming from your ulcer after it has been cleaned by you or your doctor.  You have warmth or a bad smell coming from your ulcer. This information is not intended to replace advice given to you by your health care provider. Make sure you discuss any questions you have with your health care provider. Document Released: 02/22/2004 Document Revised: 06/22/2015 Document Reviewed: 05/25/2014 Elsevier Interactive Patient Education  2018 Elsevier Inc.  

## 2017-08-26 ENCOUNTER — Ambulatory Visit (INDEPENDENT_AMBULATORY_CARE_PROVIDER_SITE_OTHER): Payer: Medicare Other | Admitting: Physician Assistant

## 2017-08-26 VITALS — BP 149/86 | HR 62 | Resp 16 | Wt 164.0 lb

## 2017-08-26 DIAGNOSIS — I2510 Atherosclerotic heart disease of native coronary artery without angina pectoris: Secondary | ICD-10-CM | POA: Diagnosis not present

## 2017-08-26 DIAGNOSIS — I872 Venous insufficiency (chronic) (peripheral): Secondary | ICD-10-CM

## 2017-08-26 NOTE — Patient Instructions (Signed)

## 2017-08-26 NOTE — Progress Notes (Signed)
Pt is a 77 yo male who was seen on 08/19/17 for chronic venous stasis who was wrapped in unna boot. He is down 4lbs in weight today and doing much better. He does have compression stockings at home.   No edema in ankle or legs. 1+edema in bilateral feet.   Swelling is much better today. Weight down. He does take OTC diuretic that he feels like helps. He had labs done at cardiologist. Discussed chronic nature of venous stasis. Work on compression, elevation, diruretic. Watch salt intake. If swelling worsening or has SOB follow up in clinic. We could consider prescription diuretic at some point. Keep weighing to look at weight gain and swelling. Wrapped in ace wrap today only. Follow up with PCP in 1 month.

## 2017-08-31 ENCOUNTER — Encounter: Payer: Self-pay | Admitting: Physician Assistant

## 2017-09-16 ENCOUNTER — Encounter: Payer: Self-pay | Admitting: Sports Medicine

## 2017-09-16 ENCOUNTER — Ambulatory Visit (INDEPENDENT_AMBULATORY_CARE_PROVIDER_SITE_OTHER): Payer: Medicare Other | Admitting: Sports Medicine

## 2017-09-16 DIAGNOSIS — I2510 Atherosclerotic heart disease of native coronary artery without angina pectoris: Secondary | ICD-10-CM

## 2017-09-16 DIAGNOSIS — M6281 Muscle weakness (generalized): Secondary | ICD-10-CM | POA: Diagnosis not present

## 2017-09-16 NOTE — Progress Notes (Signed)
   Russell Neal just needed his brace adjusted.  Impression and Recommendations:    Quadriceps weakness Needed adjustment of the new brace, adjusted, comfortable, no further intervention needed. Return as needed.  ___________________________________________ [name]. Dianah Field, M.D., ABFM., CAQSM. Primary Care and [place] Instructor of [place] [employer] [facility] of Medicine

## 2017-09-16 NOTE — Assessment & Plan Note (Signed)
Needed adjustment of the new brace, adjusted, comfortable, no further intervention needed. Return as needed.

## 2017-10-09 DIAGNOSIS — E0590 Thyrotoxicosis, unspecified without thyrotoxic crisis or storm: Secondary | ICD-10-CM | POA: Diagnosis not present

## 2017-10-17 NOTE — Progress Notes (Signed)
Subjective:   Russell Neal is a 77 y.o. male who presents for an Initial Medicare Annual Wellness Visit.  Review of Systems  No ROS.  Medicare Wellness Visit. Additional risk factors are reflected in the social history.  Cardiac Risk Factors include: dyslipidemia;advanced age (>80men, >24 women);hypertension Sleep patterns: 11 hours of sleep a night. Wakes up 3-4 times a night to urinate. Feels good upon wakening   Home Safety/Smoke Alarms: Feels safe in home. Smoke alarms in place.  Living environment; lives with wife in 1 story home. No stairs. Walk in shower with grab rails in place.     Male:   CCS- 2016 pt reported    PSA- No results found for: PSA- ordered Eye Exam- January 2019     Objective:    Today's Vitals   10/28/17 1402  BP: 132/79  Pulse: 91  SpO2: 98%  Height: 5\' 11"  (1.803 m)   Body mass index is 22.59 kg/m.  Advanced Directives 10/28/2017  Does Patient Have a Medical Advance Directive? Yes  Type of Paramedic of [place];Living will  Does patient want to make changes to medical advance directive? Yes (MAU/Ambulatory/Procedural Areas - Information given)  Copy of [place] in Chart? No - copy requested    Current Medications (verified) Outpatient Encounter Medications as of 10/28/2017  Medication Sig  . aspirin 81 MG tablet Take 81 mg by mouth daily.  . Cyanocobalamin (B-12 PO) Take by mouth.  . Evolocumab (REPATHA) 140 MG/ML SOSY Inject 140 mg into the skin every 14 (fourteen) days.  . methimazole (TAPAZOLE) 5 MG tablet TAKE ONE-HALF TABLET BY MOUTH ONCE DAILY  . Multiple Vitamins-Minerals (CENTRUM SILVER PO) Take 1 capsule by mouth daily.  . Omega-3 Fatty Acids (FISH OIL) 1000 MG CAPS Take 2 capsules by mouth daily.  . sertraline (ZOLOFT) 50 MG tablet TAKE 1 TABLET BY MOUTH ONCE DAILY   No facility-administered encounter medications on file as of 10/28/2017.     Allergies (verified) Atorvastatin and  Simvastatin   History: Past Medical History:  Diagnosis Date  . Hyperlipidemia   . Hypertension    History reviewed. No pertinent surgical history. History reviewed. No pertinent family history. Social History   Socioeconomic History  . Marital status: Married    Spouse name: [name]  . Number of children: 1  . Years of education: 63  . Highest education level: 12th grade  Occupational History  . Occupation: retired    Comment: Nurse, learning disability  Social Needs  . Financial resource strain: Not hard at all  . Food insecurity:    Worry: Never true    Inability: Never true  . Transportation needs:    Medical: No    Non-medical: No  Tobacco Use  . Smoking status: Former Smoker    Types: Cigarettes    Last attempt to quit: 02/18/1973    Years since quitting: 44.7  . Smokeless tobacco: Never Used  Substance and Sexual Activity  . Alcohol use: Yes    Alcohol/week: 2.0 standard drinks    Types: 2 Cans of beer per week  . Drug use: Never  . Sexual activity: Not Currently  Lifestyle  . Physical activity:    Days per week: 3 days    Minutes per session: 60 min  . Stress: Not at all  Relationships  . Social connections:    Talks on phone: More than three times a week    Gets together: Twice a week  Attends religious service: Never    Active member of club or organization: No    Attends meetings of clubs or organizations: Never    Relationship status: Married  Other Topics Concern  . Not on file  Social History Narrative   Weight resistance, stationary bike at gym.   Tobacco Counseling Counseling given: Not Answered   Clinical Intake:  Pre-visit preparation completed: Yes  Pain : No/denies pain     Nutritional Risks: None Diabetes: No  How often do you need to have someone help you when you read instructions, pamphlets, or other written materials from your doctor or pharmacy?: 1 - Never What is the last grade level you completed in school?:  12  Interpreter Needed?: No     Activities of Daily Living In your present state of health, do you have any difficulty performing the following activities: 10/28/2017  Hearing? Y  Comment deaf in right ear, partial hearing in the left ear  Vision? N  Difficulty concentrating or making decisions? N  Walking or climbing stairs? Y  Comment cant do it  Dressing or bathing? N  Doing errands, shopping? N  Preparing Food and eating ? N  Using the Toilet? N  In the past six months, have you accidently leaked urine? Y  Comment leakage of urine x 4-5 years  Do you have problems with loss of bowel control? N  Managing your Medications? N  Managing your Finances? N  Housekeeping or managing your Housekeeping? N  Some recent data might be hidden     Immunizations and Health Maintenance Immunization History  Administered Date(s) Administered  . Influenza, High Dose Seasonal PF 10/13/2015, 10/21/2016  . Influenza, Seasonal, Injecte, Preservative Fre 11/29/2014  . Influenza,inj,Quad PF,6+ Mos 10/28/2017  . Influenza-Unspecified 11/29/2014  . Pneumococcal Conjugate-13 01/16/2017  . Pneumococcal Polysaccharide-23 01/29/2015  . Tdap 01/29/2015  . Zoster Recombinat (Shingrix) 10/22/2016, 02/12/2017   There are no preventive care reminders to display for this patient.  Patient Care Team: Hali Marry, MD as PCP - General (Family Medicine)  Indicate any recent Medical Services you may have received from other than [facility] providers in the past year (date may be approximate).    Assessment:   This is a routine wellness examination for Russell Neal. Physical assessment deferred to PCP.   Hearing/Vision screen No exam data present  Dietary issues and exercise activities discussed: Current Exercise Habits: Structured exercise class, Type of exercise: stretching;strength training/weights, Time (Minutes): 60, Frequency (Times/Week): 4, Weekly Exercise (Minutes/Week): 240, Intensity: Moderate,  Exercise limited by: orthopedic condition(s);Other - see comments Diet overall eats a healthy diet. Breakfast:banana and peanut butter and toast with coffee Lunch: sandwich, yogurt Dinner:salad       Goals    . DIET - INCREASE WATER INTAKE     Increase water intake to 64 ounces a day      Depression Screen PHQ 2/9 Scores 10/28/2017 07/17/2017 07/17/2017 10/21/2016  PHQ - 2 Score 0 2 1 2   PHQ- 9 Score - 2 - 3    Fall Risk Fall Risk  10/28/2017 07/17/2017 10/21/2016 04/19/2016  Falls in the past year? Yes No No No  Comment fell at gym but no injury  - - -  Number falls in past yr: 1 - - -  Injury with Fall? No - - -  Risk for fall due to : Impaired balance/gait - - -  Follow up Falls prevention discussed - - -    Is the patient's home free of  loose throw rugs in walkways, pet beds, electrical cords, etc?   yes      Grab bars in the bathroom? yes      Handrails on the stairs?   no      Adequate lighting?   yes     6CIT Screen 10/28/2017  What Year? 0 points  What month? 0 points  What time? 0 points  Count back from 20 0 points  Months in reverse 0 points  Repeat phrase 2 points  Total Score 2     Screening Tests Health Maintenance  Topic Date Due  . TETANUS/TDAP  01/28/2025  . INFLUENZA VACCINE  Completed  . PNA vac Low Risk Adult  Completed       Plan:    Please schedule your next medicare wellness visit with me in 1 yr.  Mr. Russell Neal , Thank you for taking time to come for your Medicare Wellness Visit. I appreciate your ongoing commitment to your health goals. Please review the following plan we discussed and let me know if I can assist you in the future.  Continue doing brain stimulating activities (puzzles, reading, adult coloring books, staying active) to keep memory sharp.   Bring a copy of your living will and/or healthcare power of attorney to your next office visit.   These are the goals we discussed: Goals    . DIET - INCREASE WATER INTAKE     Increase  water intake to 64 ounces a day       This is a list of the screening recommended for you and due dates:  Health Maintenance  Topic Date Due  . Tetanus Vaccine  01/28/2025  . Flu Shot  Completed  . Pneumonia vaccines  Completed     I have personally reviewed and noted the following in the patient's chart:   . Medical and social history . Use of alcohol, tobacco or illicit drugs  . Current medications and supplements . Functional ability and status . Nutritional status . Physical activity . Advanced directives . List of other physicians . Hospitalizations, surgeries, and ER visits in previous 12 months . Vitals . Screenings to include cognitive, depression, and falls . Referrals and appointments  In addition, I have reviewed and discussed with patient certain preventive protocols, quality metrics, and best practice recommendations. A written personalized care plan for preventive services as well as general preventive health recommendations were provided to patient.     Joanne Chars, LPN   [id number]

## 2017-10-28 ENCOUNTER — Ambulatory Visit (INDEPENDENT_AMBULATORY_CARE_PROVIDER_SITE_OTHER): Payer: Medicare Other | Admitting: *Deleted

## 2017-10-28 VITALS — BP 132/79 | HR 91 | Ht 71.0 in | Wt 164.0 lb

## 2017-10-28 DIAGNOSIS — R7301 Impaired fasting glucose: Secondary | ICD-10-CM

## 2017-10-28 DIAGNOSIS — Z0000 Encounter for general adult medical examination without abnormal findings: Secondary | ICD-10-CM

## 2017-10-28 DIAGNOSIS — Z125 Encounter for screening for malignant neoplasm of prostate: Secondary | ICD-10-CM

## 2017-10-28 DIAGNOSIS — Z23 Encounter for immunization: Secondary | ICD-10-CM

## 2017-10-28 DIAGNOSIS — E7800 Pure hypercholesterolemia, unspecified: Secondary | ICD-10-CM | POA: Diagnosis not present

## 2017-10-28 LAB — ABN TEST REFUSAL: ABN TEST REFUSED: 7600

## 2017-10-28 NOTE — Patient Instructions (Addendum)
Please schedule your next medicare wellness visit with me in 1 yr.  Russell Neal , Thank you for taking time to come for your Medicare Wellness Visit. I appreciate your ongoing commitment to your health goals. Please review the following plan we discussed and let me know if I can assist you in the future.  Continue doing brain stimulating activities (puzzles, reading, adult coloring books, staying active) to keep memory sharp.  Bring a copy of your living will and/or healthcare power of attorney to your next office visit.   These are the goals we discussed: Goals    . DIET - INCREASE WATER INTAKE     Increase water intake to 64 ounces a day

## 2017-10-29 LAB — LIPID PANEL
CHOLESTEROL: 108 mg/dL (ref <200)
HDL: 32 mg/dL — AB (ref >40)
LDL Cholesterol (Calc): 51 mg/dL (calc)
Non-HDL Cholesterol (Calc): 76 mg/dL (calc) (ref <130)
Total CHOL/HDL Ratio: 3.4 (calc) (ref <5.0)
Triglycerides: 170 mg/dL — ABNORMAL HIGH (ref <150)

## 2017-10-29 LAB — HEMOGLOBIN A1C
Hgb A1c MFr Bld: 5.3 % of total Hgb (ref <5.7)
Mean Plasma Glucose: 105 (calc)
eAG (mmol/L): 5.8 (calc)

## 2017-12-16 ENCOUNTER — Telehealth: Payer: Self-pay | Admitting: Family Medicine

## 2017-12-16 NOTE — Telephone Encounter (Signed)
Spoke w/pt he stated that he has an appt coming up and asked that this be placed in his file. I told him that I would place this up front for him to p/u.Russell KitchenMarland KitchenElouise Munroe, [place]

## 2017-12-16 NOTE — Telephone Encounter (Signed)
Patient needs another Handicap form filled out to take to [facility] since his has expired. Call wife when this is completed. Thanks

## 2017-12-16 NOTE — Telephone Encounter (Signed)
Agree with documentation as above.   Catherine [name], MD  

## 2017-12-22 ENCOUNTER — Other Ambulatory Visit: Payer: Self-pay | Admitting: Family Medicine

## 2017-12-29 ENCOUNTER — Encounter: Payer: Self-pay | Admitting: Family Medicine

## 2017-12-29 ENCOUNTER — Ambulatory Visit (INDEPENDENT_AMBULATORY_CARE_PROVIDER_SITE_OTHER): Payer: Medicare Other | Admitting: Family Medicine

## 2017-12-29 VITALS — BP 134/87 | HR 70 | Ht 71.0 in | Wt 170.0 lb

## 2017-12-29 DIAGNOSIS — R4586 Emotional lability: Secondary | ICD-10-CM | POA: Diagnosis not present

## 2017-12-29 DIAGNOSIS — I2510 Atherosclerotic heart disease of native coronary artery without angina pectoris: Secondary | ICD-10-CM | POA: Diagnosis not present

## 2017-12-29 DIAGNOSIS — E0590 Thyrotoxicosis, unspecified without thyrotoxic crisis or storm: Secondary | ICD-10-CM | POA: Diagnosis not present

## 2017-12-29 DIAGNOSIS — I10 Essential (primary) hypertension: Secondary | ICD-10-CM

## 2017-12-29 NOTE — Progress Notes (Signed)
Subjective:    CC: BP and mood f/u -  HPI:  Hypertension- Pt denies chest pain, SOB, dizziness, or heart palpitations.  Taking meds as directed w/o problems.  Denies medication side effects.    F/U CAD - No recent CP or SOB/.    F/U Mood -on sertraline 50 mg daily.  He says overall he is doing okay he does struggle sometimes with feeling a little bit more irritable at times and getting quick to anger when he gets frustrated if he is unable to do something that he would really wants to be able to do.  He follows with Dr. Steffanie Dunn, endocrinology for his subclinical hyperthyroidism.  He is currently on methimazole.  He is doing well on his regimen his dose has not changed recently.  Past medical history, Surgical history, Family history not pertinant except as noted below, Social history, Allergies, and medications have been entered into the medical record, reviewed, and corrections made.   Review of Systems: No fevers, chills, night sweats, weight loss, chest pain, or shortness of breath.   Objective:    General: Well Developed, well nourished, and in no acute distress.  Neuro: Alert and oriented x3, extra-ocular muscles intact, sensation grossly intact.  HEENT: Normocephalic, atraumatic  Skin: Warm and dry, no rashes. Cardiac: Regular rate and rhythm, no murmurs rubs or gallops, no lower extremity edema.  Respiratory: Clear to auscultation bilaterally. Not using accessory muscles, speaking in full sentences.   Impression and Recommendations:    HTN -well controlled.  Continue current regimen.  Follow-up in 6 months.  Labs are up-to-date.  CAD -table.  Asymptomatic.  Mood change-we discussed options.  We will try increasing sertraline to 75 mg to see if it helps with some of the irritability that he is been experiencing.  He also reports some issues with his short-term memory like remembering to grab his handkerchief to put in his pocket when he is getting ready to head out the door.   He recently had brief memory testing done at his Medicare wellness exam.    Subclinical hyperthyroidism-followed by Dr. Veva Holes.

## 2017-12-29 NOTE — Patient Instructions (Signed)
OK to increase the sertraline to 1.5 tabs daily.  Let me know how you are doing in about a month.

## 2018-02-18 DIAGNOSIS — Z08 Encounter for follow-up examination after completed treatment for malignant neoplasm: Secondary | ICD-10-CM | POA: Diagnosis not present

## 2018-02-18 DIAGNOSIS — L821 Other seborrheic keratosis: Secondary | ICD-10-CM | POA: Diagnosis not present

## 2018-02-18 DIAGNOSIS — D692 Other nonthrombocytopenic purpura: Secondary | ICD-10-CM | POA: Diagnosis not present

## 2018-02-18 DIAGNOSIS — L570 Actinic keratosis: Secondary | ICD-10-CM | POA: Diagnosis not present

## 2018-02-18 DIAGNOSIS — Z85820 Personal history of malignant melanoma of skin: Secondary | ICD-10-CM | POA: Diagnosis not present

## 2018-03-13 ENCOUNTER — Telehealth: Payer: Self-pay | Admitting: Sports Medicine

## 2018-03-16 ENCOUNTER — Ambulatory Visit (INDEPENDENT_AMBULATORY_CARE_PROVIDER_SITE_OTHER): Payer: Medicare Other | Admitting: Sports Medicine

## 2018-03-16 DIAGNOSIS — M6281 Muscle weakness (generalized): Secondary | ICD-10-CM

## 2018-03-16 NOTE — Telephone Encounter (Signed)
Opened in error

## 2018-03-16 NOTE — Progress Notes (Addendum)
Subjective:    CC: Follow-up  HPI: Russell Neal returns, he has left-sided quadriceps atrophy, 0/5 extension strength at the knee.  He has historically done well with a hinged knee brace, unlocked from 0 to 90 degrees.  Unfortunately it broke.  I reviewed the past medical history, family history, social history, surgical history, and allergies today and no changes were needed.  Please see the problem list section below in epic for further details.  Past Medical History: Past Medical History:  Diagnosis Date  . Hyperlipidemia   . Hypertension    Past Surgical History: No past surgical history on file. Social History: Social History   Socioeconomic History  . Marital status: Married    Spouse name: [name]  . Number of children: 1  . Years of education: 67  . Highest education level: 12th grade  Occupational History  . Occupation: retired    Comment: Nurse, learning disability  Social Needs  . Financial resource strain: Not hard at all  . Food insecurity:    Worry: Never true    Inability: Never true  . Transportation needs:    Medical: No    Non-medical: No  Tobacco Use  . Smoking status: Former Smoker    Types: Cigarettes    Last attempt to quit: 02/18/1973    Years since quitting: 45.1  . Smokeless tobacco: Never Used  Substance and Sexual Activity  . Alcohol use: Yes    Alcohol/week: 2.0 standard drinks    Types: 2 Cans of beer per week  . Drug use: Never  . Sexual activity: Not Currently  Lifestyle  . Physical activity:    Days per week: 3 days    Minutes per session: 60 min  . Stress: Not at all  Relationships  . Social connections:    Talks on phone: More than three times a week    Gets together: Twice a week    Attends religious service: Never    Active member of club or organization: No    Attends meetings of clubs or organizations: Never    Relationship status: Married  Other Topics Concern  . Not on file  Social History Narrative   Weight resistance,  stationary bike at gym.   Family History: No family history on file. Allergies: Allergies  Allergen Reactions  . Atorvastatin Other (See Comments)    Muscle weakness  . Simvastatin Other (See Comments)    Muscle weakness   Medications: See med rec.  Review of Systems: No fevers, chills, night sweats, weight loss, chest pain, or shortness of breath.   Objective:    General: Well Developed, well nourished, and in no acute distress.  Neuro: Alert and oriented x3, extra-ocular muscles intact, sensation grossly intact.  HEENT: Normocephalic, atraumatic, pupils equal round reactive to light, neck supple, no masses, no lymphadenopathy, thyroid nonpalpable.  Skin: Warm and dry, no rashes. Cardiac: Regular rate and rhythm, no murmurs rubs or gallops, no lower extremity edema.  Respiratory: Clear to auscultation bilaterally. Not using accessory muscles, speaking in full sentences. Left leg: Severe atrophy of quadricep, and 0/5 extension strength of the knee.  Impression and Recommendations:    Quadriceps weakness Russell Neal has significant left quadriceps weakness, atrophy, mostly of the knee extensors.  This has resulted in inability of the left knee to extend, as well as significant instability that require stabilization with an orthosis.  Russell Neal additionally presents with left foot drop, as well as a varus foot deformity which also requires stabilization in all  3 planes for safety during ambulation.  He requires a custom molded device due to the atropic nature of his anatomy, and due to the need for stabilization of the knee, foot, and ankle in more than one plane he would benefit from a stance control feature for improved function and a more anatomically correct gait.   I spent 25 minutes with this patient, greater than 50% was face-to-face time counseling regarding the above diagnoses but this time was spent trying to adjust his brace, and making the decision doing counseling regarding a  new custom knee brace. ___________________________________________ [name]. Dianah Field, M.D., ABFM., CAQSM. Primary Care and Sports Medicine [facility]  Adjunct Professor of [place] [employer]

## 2018-03-16 NOTE — Assessment & Plan Note (Addendum)
Russell Neal has significant left quadriceps weakness, atrophy, mostly of the knee extensors.  This has resulted in inability of the left knee to extend, as well as significant instability that require stabilization with an orthosis.  Russell Neal additionally presents with left foot drop, as well as a varus foot deformity which also requires stabilization in all 3 planes for safety during ambulation.  He requires a custom molded device due to the atropic nature of his anatomy, and due to the need for stabilization of the knee, foot, and ankle in more than one plane he would benefit from a stance control feature for improved function and a more anatomically correct gait.

## 2018-03-17 ENCOUNTER — Telehealth: Payer: Self-pay | Admitting: Family Medicine

## 2018-03-17 NOTE — Telephone Encounter (Signed)
Received generic VM on Dr Mcneil Sober line from Pt's wife. Attempted callback, no answer. Assuming this is regarding the referral to [facility] for orthotics. On VM advised Pt's wife of where referral was sent and their phone number. Advised her to contact clinic back if she has additional questions or concerns. Callback information provided.

## 2018-03-17 NOTE — Telephone Encounter (Signed)
Pt's wife called back, just wanted to let Dr T know that they are meeting rep here for braces on 03/25/18 at 2:30.   FYI to Dr T.. no needs at this time.

## 2018-03-19 ENCOUNTER — Telehealth: Payer: Self-pay | Admitting: Family Medicine

## 2018-03-19 NOTE — Telephone Encounter (Signed)
Dr Darene LamerBeckie Busing with [facility] called.  Mrs. Junious wants you to be in on her husband's appointment with the Prosthisist.  The appointment with the Prosthisist is on February 26th.  Mrs. Riling wants the Prosthisist to come here so that you will be present when the Prosthisist evaluates Mr Correia. If this is not possible, they can inform you by phone or email - [email or web address]

## 2018-03-19 NOTE — Telephone Encounter (Signed)
Called [facility]. The appointment was originally scheduled for 03/25/18 at 1:30pm. Moved appointment to 03/26/18 at 1:30 so Provider can step in.

## 2018-03-19 NOTE — Telephone Encounter (Signed)
I can step and and show my face, what time is the appointment?  Because it is my half day.

## 2018-03-19 NOTE — Telephone Encounter (Signed)
[facility] will contact Pt's wife and advise of appt change.

## 2018-03-19 NOTE — Telephone Encounter (Signed)
Oh ok, I was gonna stick around.  But yeah the other date works too!

## 2018-04-09 DIAGNOSIS — E0590 Thyrotoxicosis, unspecified without thyrotoxic crisis or storm: Secondary | ICD-10-CM | POA: Diagnosis not present

## 2018-04-09 IMAGING — DX DG FOOT COMPLETE 3+V*L*
3 series · 3 of 3 positions shown · non-contrast
Comparison: None.

CLINICAL DATA: Swelling over the last week.

EXAM:
LEFT FOOT - COMPLETE 3+ VIEW

[foot ap]
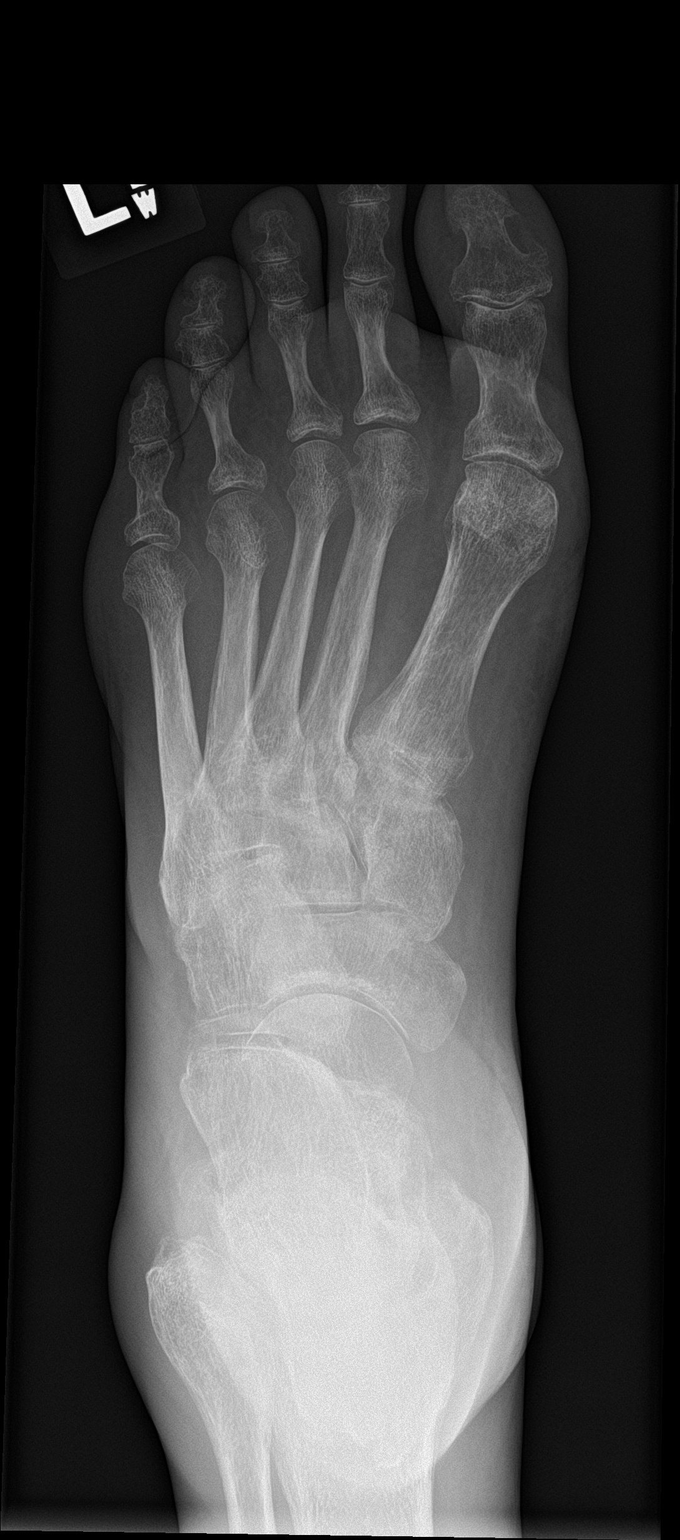

[foot obl]
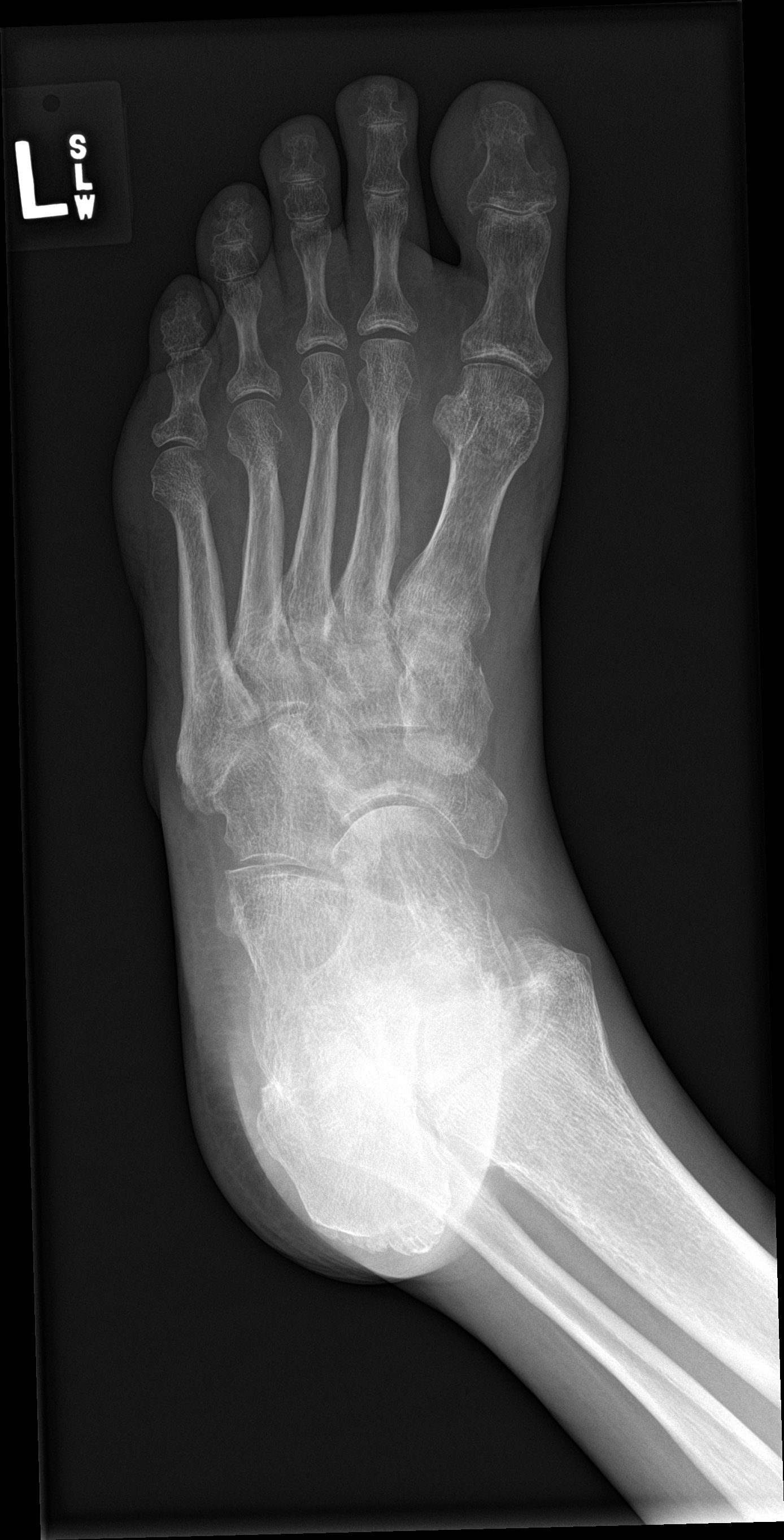

[foot lat]
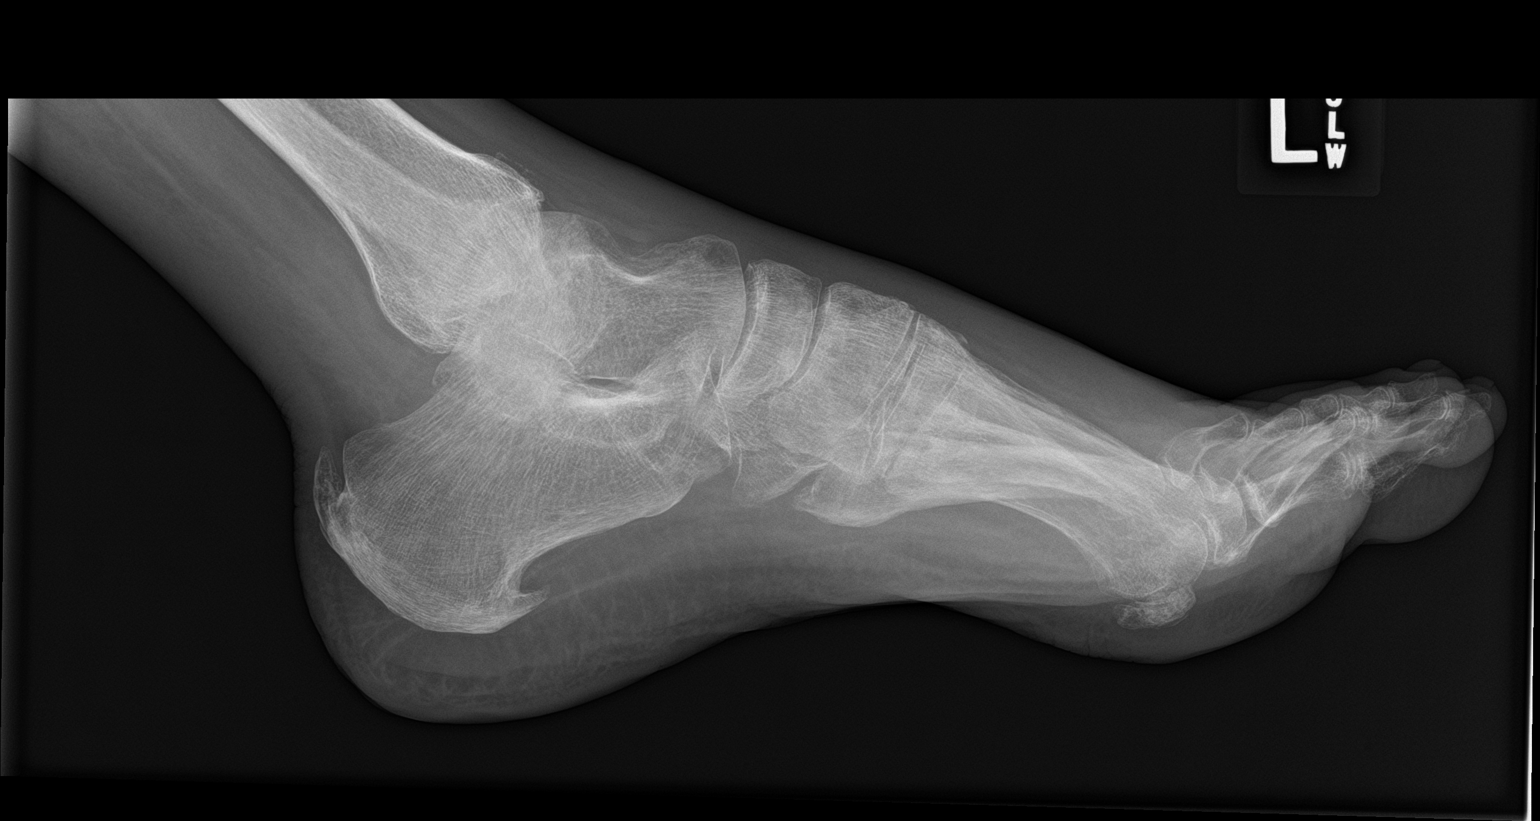

[3 of 3 positions shown; findings below may reference images not displayed]

FINDINGS: Nonspecific soft tissue swelling. Chronic calcaneal spurs. No other
osseous or articular finding in the foot.
IMPRESSION: No likely significant finding.  Chronic calcaneal spurs.

## 2018-04-10 ENCOUNTER — Other Ambulatory Visit: Payer: Self-pay | Admitting: Family Medicine

## 2018-04-14 ENCOUNTER — Other Ambulatory Visit: Payer: Self-pay

## 2018-04-14 ENCOUNTER — Encounter: Payer: Self-pay | Admitting: Family Medicine

## 2018-04-14 ENCOUNTER — Ambulatory Visit (INDEPENDENT_AMBULATORY_CARE_PROVIDER_SITE_OTHER): Payer: Medicare Other | Admitting: Family Medicine

## 2018-04-14 VITALS — BP 157/93 | HR 72 | Temp 98.3°F | Ht 71.0 in | Wt 166.0 lb

## 2018-04-14 DIAGNOSIS — R140 Abdominal distension (gaseous): Secondary | ICD-10-CM

## 2018-04-14 DIAGNOSIS — K5901 Slow transit constipation: Secondary | ICD-10-CM | POA: Diagnosis not present

## 2018-04-14 DIAGNOSIS — R0789 Other chest pain: Secondary | ICD-10-CM | POA: Diagnosis not present

## 2018-04-14 NOTE — Progress Notes (Addendum)
Acute Office Visit  Subjective:    Patient ID: Russell Neal, male    DOB: 1940-05-18, 78 y.o.   MRN: [id number]  Chief Complaint  Patient presents with  . Abdominal Pain    pt reports that his sxs began last night after he drank some flat ginger ale. he stated that his lower abdomen feels bloated and he is also c/o chest tightness. he has not had a BM in 3 days and stated that he normally takes a laxitive that helps him to go and this has not helped at all. denies f/s/c he does c/o being a little nauseated    HPI Patient is in today for Lower abdominal bloating and some chest tightness. Has been gassy.  He denies any heartburn or excess belching.  Drank a flat gingerale today.  Has some pain under both axilla today.  Hasn't had a BM in the last 3 days.  Says tried an OTC laxative but didn't really help.  Says normally that laxative works well for him but this time it did not.  Had fruit and peanut butter toast this morning. No fever or vomiting.  He admits he does not drink a lot during the day because it makes him urinate.  He denies any blood in the stool.  Denies any fever sweats or chills.  He has been just a little bit nauseated.  Eyes any cough or other upper respiratory symptoms.  In regards to the chest pain no worsening or alleviating factors.  It seems to have improved and is now just a little bit of an aching near his axilla bilaterally.  Past Medical History:  Diagnosis Date  . Hyperlipidemia   . Hypertension     History reviewed. No pertinent surgical history.  History reviewed. No pertinent family history.  Social History   Socioeconomic History  . Marital status: Married    Spouse name: [name]  . Number of children: 1  . Years of education: 84  . Highest education level: 12th grade  Occupational History  . Occupation: retired    Comment: Nurse, learning disability  Social Needs  . Financial resource strain: Not hard at all  . Food insecurity:    Worry: Never true     Inability: Never true  . Transportation needs:    Medical: No    Non-medical: No  Tobacco Use  . Smoking status: Former Smoker    Types: Cigarettes    Last attempt to quit: 02/18/1973    Years since quitting: 45.1  . Smokeless tobacco: Never Used  Substance and Sexual Activity  . Alcohol use: Yes    Alcohol/week: 2.0 standard drinks    Types: 2 Cans of beer per week  . Drug use: Never  . Sexual activity: Not Currently  Lifestyle  . Physical activity:    Days per week: 3 days    Minutes per session: 60 min  . Stress: Not at all  Relationships  . Social connections:    Talks on phone: More than three times a week    Gets together: Twice a week    Attends religious service: Never    Active member of club or organization: No    Attends meetings of clubs or organizations: Never    Relationship status: Married  . Intimate partner violence:    Fear of current or ex partner: No    Emotionally abused: No    Physically abused: No    Forced sexual activity: No  Other Topics  Concern  . Not on file  Social History Narrative   Weight resistance, stationary bike at gym.    Outpatient Medications Prior to Visit  Medication Sig Dispense Refill  . aspirin 81 MG tablet Take 81 mg by mouth daily.    . Cyanocobalamin (B-12 PO) Take by mouth.    . Evolocumab (REPATHA) 140 MG/ML SOSY Inject 140 mg into the skin every 14 (fourteen) days.    . methimazole (TAPAZOLE) 5 MG tablet TAKE ONE-HALF TABLET BY MOUTH ONCE DAILY    . Multiple Vitamins-Minerals (CENTRUM SILVER PO) Take 1 capsule by mouth daily.    . Omega-3 Fatty Acids (FISH OIL) 1000 MG CAPS Take 2 capsules by mouth daily.    . sertraline (ZOLOFT) 50 MG tablet Take 1 tablet by mouth once daily 90 tablet 1   No facility-administered medications prior to visit.     Allergies  Allergen Reactions  . Atorvastatin Other (See Comments)    Muscle weakness  . Simvastatin Other (See Comments)    Muscle weakness    ROS     Objective:     Physical Exam  Constitutional: He is oriented to person, place, and time. He appears well-developed and well-nourished.  HENT:  Head: Normocephalic and atraumatic.  Right Ear: External ear normal.  Left Ear: External ear normal.  Nose: Nose normal.  Mouth/Throat: Oropharynx is clear and moist.  Eyes: Pupils are equal, round, and reactive to light. Conjunctivae and EOM are normal.  Neck: Normal range of motion. Neck supple.  Cardiovascular: Normal rate, regular rhythm, normal heart sounds and intact distal pulses.  Pulmonary/Chest: Effort normal and breath sounds normal.  Abdominal: Soft. Bowel sounds are normal. He exhibits no distension and no mass. There is no abdominal tenderness. There is no rebound and no guarding.  Musculoskeletal: Normal range of motion.  Neurological: He is alert and oriented to person, place, and time. He has normal reflexes.  Skin: Skin is warm and dry.  Psychiatric: He has a normal mood and affect. His behavior is normal. Judgment and thought content normal.    BP (!) 157/93   Pulse 72   Temp 98.3 F (36.8 C)   Ht 5\' 11"  (1.803 m)   Wt 166 lb (75.3 kg)   SpO2 97%   BMI 23.15 kg/m  Wt Readings from Last 3 Encounters:  04/14/18 166 lb (75.3 kg)  03/16/18 167 lb (75.8 kg)  12/29/17 170 lb (77.1 kg)    There are no preventive care reminders to display for this patient.  There are no preventive care reminders to display for this patient.   No results found for: TSH Lab Results  Component Value Date   WBC 6.9 06/28/2016   HGB 14.8 06/28/2016   HCT 42.3 06/28/2016   MCV 94.6 06/28/2016   PLT 180 06/28/2016   Lab Results  Component Value Date   NA 137 06/28/2016   K 5.0 06/28/2016   CO2 28 06/28/2016   GLUCOSE 89 06/28/2016   BUN 18 06/28/2016   CREATININE 0.50 (L) 06/28/2016   BILITOT 0.6 06/28/2016   ALKPHOS 70 06/28/2016   AST 27 06/28/2016   ALT 31 06/28/2016   PROT 6.6 06/28/2016   ALBUMIN 4.2 06/28/2016   CALCIUM 9.6  06/28/2016   Lab Results  Component Value Date   CHOL 108 10/28/2017   Lab Results  Component Value Date   HDL 32 (L) 10/28/2017   Lab Results  Component Value Date   LDLCALC 51 10/28/2017  Lab Results  Component Value Date   TRIG 170 (H) 10/28/2017   Lab Results  Component Value Date   CHOLHDL 3.4 10/28/2017   Lab Results  Component Value Date   HGBA1C 5.3 10/28/2017       Assessment & Plan:   Problem List Items Addressed This Visit    None    Visit Diagnoses    Slow transit constipation    -  Primary   Atypical chest pain       Relevant Orders   EKG 12-Lead   Bloating         Constipation-I think history exam is most consistent with constipation.  Recommend magnesium citrate.  He says he has used that in the past and it has been helpful.  If he needs to repeat the dose the following day he can.  He might even want to consider a suppository or maybe even enema if he still having problems at that point.  I reassured him that I do not think this is coronavirus.  Atypical chest pain-unclear etiology may be related to the gas.  Is not worse or better with eating or not eating or with activity or no activity.  It seems to have been mostly in the upper chest radiating to his axilla bilaterally which is a little unusual and he denies any excess heartburn.  We did do an EKG today as well.  EKG today showed rate of 67 bpm with a right bundle branch block.  We do not have his last EKG was actually performed at [place] with his cardiology office so we will fax over his EKG just to have them compare to prior and make sure that they do not see any differences or changes.  No orders of the defined types were placed in this encounter.    Beatrice Lecher, MD

## 2018-04-14 NOTE — Patient Instructions (Addendum)
Recommend magnesium citrate to get bowels moving.  You can repeat the dose the next day if needed.  After that work on really trying to increase fiber in the diet to soften the stools.  This can be done with either a stool softener or with a product such as Benefiber but you will need to have a good cleanout before you start it for it to be effective.   Constipation, Adult Constipation is when a person:  Poops (has a bowel movement) fewer times in a week than normal.  Has a hard time pooping.  Has poop that is dry, hard, or bigger than normal. Follow these instructions at home: Eating and drinking   Eat foods that have a lot of fiber, such as: ? Fresh fruits and vegetables. ? Whole grains. ? Beans.  Eat less of foods that are high in fat, low in fiber, or overly processed, such as: ? [place] fries. ? Hamburgers. ? Cookies. ? Candy. ? Soda.  Drink enough fluid to keep your pee (urine) clear or pale yellow. General instructions  Exercise regularly or as told by your doctor.  Go to the restroom when you feel like you need to poop. Do not hold it in.  Take over-the-counter and prescription medicines only as told by your doctor. These include any fiber supplements.  Do pelvic floor retraining exercises, such as: ? Doing deep breathing while relaxing your lower belly (abdomen). ? Relaxing your pelvic floor while pooping.  Watch your condition for any changes.  Keep all follow-up visits as told by your doctor. This is important. Contact a doctor if:  You have pain that gets worse.  You have a fever.  You have not pooped for 4 days.  You throw up (vomit).  You are not hungry.  You lose weight.  You are bleeding from the anus.  You have thin, pencil-like poop (stool). Get help right away if:  You have a fever, and your symptoms suddenly get worse.  You leak poop or have blood in your poop.  Your belly feels hard or bigger than normal (is bloated).  You have  very bad belly pain.  You feel dizzy or you faint. This information is not intended to replace advice given to you by your health care provider. Make sure you discuss any questions you have with your health care provider. Document Released: 07/03/2007 Document Revised: 08/04/2015 Document Reviewed: 07/05/2015 Elsevier Interactive Patient Education  2019 [employer].

## 2018-07-06 ENCOUNTER — Ambulatory Visit: Payer: Medicare Other | Admitting: Family Medicine

## 2018-07-27 DIAGNOSIS — Z08 Encounter for follow-up examination after completed treatment for malignant neoplasm: Secondary | ICD-10-CM | POA: Diagnosis not present

## 2018-07-27 DIAGNOSIS — Z85820 Personal history of malignant melanoma of skin: Secondary | ICD-10-CM | POA: Diagnosis not present

## 2018-07-27 DIAGNOSIS — L570 Actinic keratosis: Secondary | ICD-10-CM | POA: Diagnosis not present

## 2018-08-17 ENCOUNTER — Ambulatory Visit (INDEPENDENT_AMBULATORY_CARE_PROVIDER_SITE_OTHER): Payer: Medicare Other | Admitting: Family Medicine

## 2018-08-17 ENCOUNTER — Encounter: Payer: Self-pay | Admitting: Family Medicine

## 2018-08-17 VITALS — BP 146/80 | Ht 71.0 in | Wt 164.0 lb

## 2018-08-17 DIAGNOSIS — I10 Essential (primary) hypertension: Secondary | ICD-10-CM

## 2018-08-17 DIAGNOSIS — I2510 Atherosclerotic heart disease of native coronary artery without angina pectoris: Secondary | ICD-10-CM | POA: Diagnosis not present

## 2018-08-17 DIAGNOSIS — R4586 Emotional lability: Secondary | ICD-10-CM | POA: Diagnosis not present

## 2018-08-17 NOTE — Progress Notes (Signed)
Virtual Visit via Telephone Note  I connected with Russell Neal on 08/17/18 at  2:00 PM EDT by telephone and verified that I am speaking with the correct person using two identifiers.   I discussed the limitations, risks, security and privacy concerns of performing an evaluation and management service by telephone and the availability of in person appointments. I also discussed with the patient that there may be a patient responsible charge related to this service. The patient expressed understanding and agreed to proceed.   Subjective:    CC:   HPI: Hypertension- Pt denies chest pain, SOB, dizziness, or heart palpitations.  Taking meds as directed w/o problems.  Denies medication side effects.  BP was good when saw Dr. Steffanie Dunn in March.   F/U Mood - he is on zoloft 50 mg and doing well.  He is happy with his dose.     Past medical history, Surgical history, Family history not pertinant except as noted below, Social history, Allergies, and medications have been entered into the medical record, reviewed, and corrections made.   Review of Systems: No fevers, chills, night sweats, weight loss, chest pain, or shortness of breath.   Objective:    General: Speaking clearly in complete sentences without any shortness of breath.  Alert and oriented x3.  Normal judgment. No apparent acute distress.     Impression and Recommendations:    HTN - Not well controlled today. It was good in March. Continue current regimen. Follow up in  6 months.  Mood - Doing well on regimen.   CAD - has follow with Dr. Beverlyn Roux next month.  Talk to him about that BO.      I discussed the assessment and treatment plan with the patient. The patient was provided an opportunity to ask questions and all were answered. The patient agreed with the plan and demonstrated an understanding of the instructions.   The patient was advised to call back or seek an in-person evaluation if the symptoms worsen or if the  condition fails to improve as anticipated.  I provided 20 minutes of non-face-to-face time during this encounter.   Beatrice Lecher, MD

## 2018-08-17 NOTE — Assessment & Plan Note (Signed)
BP is better than previous. I think needs to be on a BP med. He wants to discuss with Dr. Beverlyn Roux next month at his follow up.

## 2018-08-19 DIAGNOSIS — R220 Localized swelling, mass and lump, head: Secondary | ICD-10-CM | POA: Diagnosis not present

## 2018-08-19 DIAGNOSIS — S0083XA Contusion of other part of head, initial encounter: Secondary | ICD-10-CM | POA: Diagnosis not present

## 2018-08-19 DIAGNOSIS — G8911 Acute pain due to trauma: Secondary | ICD-10-CM | POA: Diagnosis not present

## 2018-08-19 DIAGNOSIS — M7989 Other specified soft tissue disorders: Secondary | ICD-10-CM | POA: Diagnosis not present

## 2018-08-19 DIAGNOSIS — K219 Gastro-esophageal reflux disease without esophagitis: Secondary | ICD-10-CM | POA: Diagnosis not present

## 2018-08-19 DIAGNOSIS — Z79899 Other long term (current) drug therapy: Secondary | ICD-10-CM | POA: Diagnosis not present

## 2018-08-19 DIAGNOSIS — M25562 Pain in left knee: Secondary | ICD-10-CM | POA: Diagnosis not present

## 2018-08-19 DIAGNOSIS — Z23 Encounter for immunization: Secondary | ICD-10-CM | POA: Diagnosis not present

## 2018-08-19 DIAGNOSIS — Z87891 Personal history of nicotine dependence: Secondary | ICD-10-CM | POA: Diagnosis not present

## 2018-08-19 DIAGNOSIS — I10 Essential (primary) hypertension: Secondary | ICD-10-CM | POA: Diagnosis not present

## 2018-08-19 DIAGNOSIS — E039 Hypothyroidism, unspecified: Secondary | ICD-10-CM | POA: Diagnosis not present

## 2018-08-19 DIAGNOSIS — Z043 Encounter for examination and observation following other accident: Secondary | ICD-10-CM | POA: Diagnosis not present

## 2018-08-19 DIAGNOSIS — S161XXA Strain of muscle, fascia and tendon at neck level, initial encounter: Secondary | ICD-10-CM | POA: Diagnosis not present

## 2018-08-19 DIAGNOSIS — E785 Hyperlipidemia, unspecified: Secondary | ICD-10-CM | POA: Diagnosis not present

## 2018-08-19 DIAGNOSIS — Z888 Allergy status to other drugs, medicaments and biological substances status: Secondary | ICD-10-CM | POA: Diagnosis not present

## 2018-08-19 DIAGNOSIS — Z7982 Long term (current) use of aspirin: Secondary | ICD-10-CM | POA: Diagnosis not present

## 2018-08-21 ENCOUNTER — Other Ambulatory Visit: Payer: Self-pay | Admitting: Family Medicine

## 2018-08-27 ENCOUNTER — Encounter: Payer: Self-pay | Admitting: Family Medicine

## 2018-08-27 ENCOUNTER — Ambulatory Visit (INDEPENDENT_AMBULATORY_CARE_PROVIDER_SITE_OTHER): Payer: Medicare Other | Admitting: Family Medicine

## 2018-08-27 ENCOUNTER — Other Ambulatory Visit: Payer: Self-pay

## 2018-08-27 VITALS — BP 132/82 | HR 69 | Ht 71.0 in | Wt 163.0 lb

## 2018-08-27 DIAGNOSIS — S8012XA Contusion of left lower leg, initial encounter: Secondary | ICD-10-CM | POA: Diagnosis not present

## 2018-08-27 DIAGNOSIS — E785 Hyperlipidemia, unspecified: Secondary | ICD-10-CM | POA: Diagnosis not present

## 2018-08-27 DIAGNOSIS — K7689 Other specified diseases of liver: Secondary | ICD-10-CM | POA: Diagnosis not present

## 2018-08-27 DIAGNOSIS — R0789 Other chest pain: Secondary | ICD-10-CM | POA: Diagnosis not present

## 2018-08-27 DIAGNOSIS — R1013 Epigastric pain: Secondary | ICD-10-CM | POA: Diagnosis not present

## 2018-08-27 DIAGNOSIS — R4586 Emotional lability: Secondary | ICD-10-CM | POA: Diagnosis not present

## 2018-08-27 DIAGNOSIS — I25110 Atherosclerotic heart disease of native coronary artery with unstable angina pectoris: Secondary | ICD-10-CM | POA: Diagnosis not present

## 2018-08-27 DIAGNOSIS — M25562 Pain in left knee: Secondary | ICD-10-CM | POA: Diagnosis not present

## 2018-08-27 DIAGNOSIS — M549 Dorsalgia, unspecified: Secondary | ICD-10-CM | POA: Diagnosis not present

## 2018-08-27 DIAGNOSIS — Z955 Presence of coronary angioplasty implant and graft: Secondary | ICD-10-CM | POA: Diagnosis not present

## 2018-08-27 DIAGNOSIS — R079 Chest pain, unspecified: Secondary | ICD-10-CM | POA: Diagnosis not present

## 2018-08-27 DIAGNOSIS — Z79899 Other long term (current) drug therapy: Secondary | ICD-10-CM | POA: Diagnosis not present

## 2018-08-27 DIAGNOSIS — I2510 Atherosclerotic heart disease of native coronary artery without angina pectoris: Secondary | ICD-10-CM | POA: Diagnosis not present

## 2018-08-27 DIAGNOSIS — L03116 Cellulitis of left lower limb: Secondary | ICD-10-CM

## 2018-08-27 DIAGNOSIS — E7800 Pure hypercholesterolemia, unspecified: Secondary | ICD-10-CM | POA: Diagnosis not present

## 2018-08-27 DIAGNOSIS — K219 Gastro-esophageal reflux disease without esophagitis: Secondary | ICD-10-CM | POA: Diagnosis not present

## 2018-08-27 DIAGNOSIS — R072 Precordial pain: Secondary | ICD-10-CM | POA: Diagnosis not present

## 2018-08-27 DIAGNOSIS — Z7982 Long term (current) use of aspirin: Secondary | ICD-10-CM | POA: Diagnosis not present

## 2018-08-27 DIAGNOSIS — Z87891 Personal history of nicotine dependence: Secondary | ICD-10-CM | POA: Diagnosis not present

## 2018-08-27 DIAGNOSIS — I160 Hypertensive urgency: Secondary | ICD-10-CM | POA: Diagnosis not present

## 2018-08-27 DIAGNOSIS — N281 Cyst of kidney, acquired: Secondary | ICD-10-CM | POA: Diagnosis not present

## 2018-08-27 DIAGNOSIS — K8020 Calculus of gallbladder without cholecystitis without obstruction: Secondary | ICD-10-CM | POA: Diagnosis not present

## 2018-08-27 DIAGNOSIS — I340 Nonrheumatic mitral (valve) insufficiency: Secondary | ICD-10-CM | POA: Diagnosis not present

## 2018-08-27 DIAGNOSIS — I10 Essential (primary) hypertension: Secondary | ICD-10-CM | POA: Diagnosis not present

## 2018-08-27 DIAGNOSIS — E0590 Thyrotoxicosis, unspecified without thyrotoxic crisis or storm: Secondary | ICD-10-CM | POA: Diagnosis not present

## 2018-08-27 DIAGNOSIS — I4510 Unspecified right bundle-branch block: Secondary | ICD-10-CM | POA: Diagnosis not present

## 2018-08-27 DIAGNOSIS — K5900 Constipation, unspecified: Secondary | ICD-10-CM | POA: Diagnosis not present

## 2018-08-27 MED ORDER — DOXYCYCLINE HYCLATE 100 MG PO TABS: 100.0000 mg | ORAL_TABLET | Freq: Two times a day (BID) | ORAL | 0 refills | Status: DC

## 2018-08-27 MED ORDER — SERTRALINE HCL 50 MG PO TABS: 75.0000 mg | ORAL_TABLET | Freq: Every day | ORAL | 0 refills | Status: DC

## 2018-08-27 NOTE — Progress Notes (Signed)
Pt fell last Wednesday night. He hit the back of his head on the L side and his L thigh and knee are very bruised his L knee is warm to the touch and swollen. Been using ice for this. His R knee was also injured in the fall but not as bad as the left.   Pt was taken to [facility] and was checked out. He has an appt on Monday w/Dr. Georgina Snell to get the fluid on his knee to get drained. I advised him and his wife that this is not something that Dr. Madilyn Fireman usually does.   Maryruth Eve, Lahoma Crocker, CMA

## 2018-08-27 NOTE — Progress Notes (Signed)
Acute Office Visit  Subjective:    Patient ID: Russell Neal, male    DOB: [birth year], 78 y.o.   MRN: [id number]  Chief Complaint  Patient presents with  . Fall    HPI Patient is in today for: Pt fell last Wednesday night. He hit the back of his head on the L side and his L thigh and knee are very bruised his L knee is warm to the touch and swollen. Been using ice for this. His R knee was also injured in the fall but not as bad as the left. He hasn't had any residual headaches.  He has been trying to keep his knee elevated and has been wearing his compression stocking.  He says it is actually not that painful.  He is just worried about reinjuring it and he was concerned that it was still quite swollen.  He has not been taking any kind of anti-inflammatory.  He has been trying to elevate and ice it periodically.  Pt was taken to [facility] and was checked out. He has an appt on Monday w/Dr. Georgina Snell to get the fluid on his knee to get drained.   Past Medical History:  Diagnosis Date  . Hyperlipidemia   . Hypertension     History reviewed. No pertinent surgical history.  History reviewed. No pertinent family history.  Social History   Socioeconomic History  . Marital status: Married    Spouse name: [name]  . Number of children: 1  . Years of education: 23  . Highest education level: 12th grade  Occupational History  . Occupation: retired    Comment: Nurse, learning disability  Social Needs  . Financial resource strain: Not hard at all  . Food insecurity    Worry: Never true    Inability: Never true  . Transportation needs    Medical: No    Non-medical: No  Tobacco Use  . Smoking status: Former Smoker    Types: Cigarettes    Quit date: 02/18/1973    Years since quitting: 45.5  . Smokeless tobacco: Never Used  Substance and Sexual Activity  . Alcohol use: Yes    Alcohol/week: 2.0 standard drinks    Types: 2 Cans of beer per week  . Drug use: Never  . Sexual activity: Not Currently   Lifestyle  . Physical activity    Days per week: 3 days    Minutes per session: 60 min  . Stress: Not at all  Relationships  . Social connections    Talks on phone: More than three times a week    Gets together: Twice a week    Attends religious service: Never    Active member of club or organization: No    Attends meetings of clubs or organizations: Never    Relationship status: Married  . Intimate partner violence    Fear of current or ex partner: No    Emotionally abused: No    Physically abused: No    Forced sexual activity: No  Other Topics Concern  . Not on file  Social History Narrative   Weight resistance, stationary bike at gym.    Outpatient Medications Prior to Visit  Medication Sig Dispense Refill  . aspirin 81 MG tablet Take 81 mg by mouth daily.    . Cyanocobalamin (B-12 PO) Take by mouth.    . Evolocumab (REPATHA) 140 MG/ML SOSY Inject 140 mg into the skin every 14 (fourteen) days.    . methimazole (TAPAZOLE) 5 MG  tablet TAKE ONE-HALF TABLET BY MOUTH ONCE DAILY    . Multiple Vitamins-Minerals (CENTRUM SILVER PO) Take 1 capsule by mouth daily.    . Omega-3 Fatty Acids (FISH OIL) 1000 MG CAPS Take 2 capsules by mouth daily.    . sertraline (ZOLOFT) 50 MG tablet Take 1 tablet by mouth once daily 90 tablet 0   No facility-administered medications prior to visit.     Allergies  Allergen Reactions  . Atorvastatin Other (See Comments)    Muscle weakness  . Simvastatin Other (See Comments)    Muscle weakness    ROS     Objective:    Physical Exam  Constitutional: He is oriented to person, place, and time. He appears well-developed and well-nourished.  HENT:  Head: Normocephalic and atraumatic.  Eyes: Conjunctivae and EOM are normal.  Cardiovascular: Normal rate.  Pulmonary/Chest: Effort normal.  Musculoskeletal:     Comments: He has extensive bruising over his left upper thigh and left lower leg.  His knee is very largely swollen.  Unable to  completely straighten his leg.  He normally uses a walker and is using his walker today.  Not a significant amount of tenderness over the knee which is reassuring.  But over the distal lower leg above the ankle it is bright red and shiny.  He does have a large scratch over the anterior knee but nothing on the lower leg.  He has some 1+ pitting edema in that left lower leg compared to no edema in his right lower leg.  He does have a known history of chronic venous stasis.  Neurological: He is alert and oriented to person, place, and time.  Skin: Skin is dry. No pallor.  Psychiatric: He has a normal mood and affect. His behavior is normal.  Vitals reviewed.   BP 132/82   Pulse 69   Ht 5\' 11"  (1.803 m)   Wt 163 lb (73.9 kg)   SpO2 99%   BMI 22.73 kg/m  Wt Readings from Last 3 Encounters:  08/27/18 163 lb (73.9 kg)  08/17/18 164 lb (74.4 kg)  04/14/18 166 lb (75.3 kg)    There are no preventive care reminders to display for this patient.  There are no preventive care reminders to display for this patient.   No results found for: TSH Lab Results  Component Value Date   WBC 6.9 06/28/2016   HGB 14.8 06/28/2016   HCT 42.3 06/28/2016   MCV 94.6 06/28/2016   PLT 180 06/28/2016   Lab Results  Component Value Date   NA 137 06/28/2016   K 5.0 06/28/2016   CO2 28 06/28/2016   GLUCOSE 89 06/28/2016   BUN 18 06/28/2016   CREATININE 0.50 (L) 06/28/2016   BILITOT 0.6 06/28/2016   ALKPHOS 70 06/28/2016   AST 27 06/28/2016   ALT 31 06/28/2016   PROT 6.6 06/28/2016   ALBUMIN 4.2 06/28/2016   CALCIUM 9.6 06/28/2016   Lab Results  Component Value Date   CHOL 108 10/28/2017   Lab Results  Component Value Date   HDL 32 (L) 10/28/2017   Lab Results  Component Value Date   LDLCALC 51 10/28/2017   Lab Results  Component Value Date   TRIG 170 (H) 10/28/2017   Lab Results  Component Value Date   CHOLHDL 3.4 10/28/2017   Lab Results  Component Value Date   HGBA1C 5.3  10/28/2017       Assessment & Plan:   Problem List Items Addressed This  Visit      Other   Mood change    Doing well on his Zoloft and is requesting a refill today.  Prescription sent       Other Visit Diagnoses    Acute pain of left knee    -  Primary   Cellulitis of left lower extremity       Contusion of left lower extremity, initial encounter        Left knee pain-quite swollen on exam but he actually feels like it is less swollen than it was last week.  Overall gave him reassurance he is really not having any significant amount of pain which is great.  We discussed adding an Ace wrap for compression and then keeping his follow-up appointment next week with Dr. Georgina Snell to see if at that point it may need some fluid drawn off.  But it sounds like it is actually starting to resolve on its own.  Okay to use Tylenol or ibuprofen as needed for pain relief.  Continue with ice and elevation as well. Cellulitis-on the lower part of that left leg the area just above the ankle is bright red and tender to touch.  He does have a large scratch over his knee where there is the possibility of bacteria being introduced him to go ahead and cover him for cellulitis.  He does have a history of venous stasis dermatitis but this looks significantly different compared to his opposite leg.   Meds ordered this encounter  Medications  . doxycycline (VIBRA-TABS) 100 MG tablet    Sig: Take 1 tablet (100 mg total) by mouth 2 (two) times daily.    Dispense:  20 tablet    Refill:  0  . sertraline (ZOLOFT) 50 MG tablet    Sig: Take 1.5 tablets (75 mg total) by mouth daily.    Dispense:  15 tablet    Refill:  0     Beatrice Lecher, MD

## 2018-08-28 ENCOUNTER — Encounter: Payer: Self-pay | Admitting: Family Medicine

## 2018-08-28 DIAGNOSIS — I358 Other nonrheumatic aortic valve disorders: Secondary | ICD-10-CM | POA: Diagnosis not present

## 2018-08-28 DIAGNOSIS — E7800 Pure hypercholesterolemia, unspecified: Secondary | ICD-10-CM | POA: Diagnosis not present

## 2018-08-28 DIAGNOSIS — E0590 Thyrotoxicosis, unspecified without thyrotoxic crisis or storm: Secondary | ICD-10-CM | POA: Diagnosis not present

## 2018-08-28 DIAGNOSIS — I5189 Other ill-defined heart diseases: Secondary | ICD-10-CM | POA: Diagnosis not present

## 2018-08-28 DIAGNOSIS — K219 Gastro-esophageal reflux disease without esophagitis: Secondary | ICD-10-CM | POA: Diagnosis not present

## 2018-08-28 DIAGNOSIS — I517 Cardiomegaly: Secondary | ICD-10-CM | POA: Diagnosis not present

## 2018-08-28 DIAGNOSIS — R079 Chest pain, unspecified: Secondary | ICD-10-CM | POA: Diagnosis not present

## 2018-08-28 DIAGNOSIS — I25110 Atherosclerotic heart disease of native coronary artery with unstable angina pectoris: Secondary | ICD-10-CM | POA: Diagnosis not present

## 2018-08-28 DIAGNOSIS — Z955 Presence of coronary angioplasty implant and graft: Secondary | ICD-10-CM | POA: Diagnosis not present

## 2018-08-28 DIAGNOSIS — I160 Hypertensive urgency: Secondary | ICD-10-CM | POA: Diagnosis not present

## 2018-08-28 DIAGNOSIS — I10 Essential (primary) hypertension: Secondary | ICD-10-CM | POA: Diagnosis not present

## 2018-08-28 MED ORDER — DIPHENHYDRAMINE HCL 25 MG PO CAPS
25.00 | ORAL_CAPSULE | ORAL | Status: DC
Start: ? — End: 2018-08-28

## 2018-08-28 MED ORDER — ASPIRIN EC 81 MG PO TBEC
81.00 | DELAYED_RELEASE_TABLET | ORAL | Status: DC
Start: 2018-08-29 — End: 2018-08-28

## 2018-08-28 MED ORDER — ALPRAZOLAM 0.25 MG PO TABS
.25 | ORAL_TABLET | ORAL | Status: DC
Start: ? — End: 2018-08-28

## 2018-08-28 MED ORDER — SENNA-DOCUSATE SODIUM 8.6-50 MG PO TABS
2.00 | ORAL_TABLET | ORAL | Status: DC
Start: 2018-08-28 — End: 2018-08-28

## 2018-08-28 MED ORDER — SERTRALINE HCL 50 MG PO TABS
25.00 | ORAL_TABLET | ORAL | Status: DC
Start: 2018-08-29 — End: 2018-08-28

## 2018-08-28 MED ORDER — GENERIC EXTERNAL MEDICATION
Status: DC
Start: ? — End: 2018-08-28

## 2018-08-28 MED ORDER — MORPHINE SULFATE (PF) 4 MG/ML IV SOLN
2.00 | INTRAVENOUS | Status: DC
Start: ? — End: 2018-08-28

## 2018-08-28 MED ORDER — THERA PO TABS
1.00 | ORAL_TABLET | ORAL | Status: DC
Start: 2018-08-29 — End: 2018-08-28

## 2018-08-28 MED ORDER — ZOLPIDEM TARTRATE 5 MG PO TABS
5.00 | ORAL_TABLET | ORAL | Status: DC
Start: ? — End: 2018-08-28

## 2018-08-28 MED ORDER — METHIMAZOLE 5 MG PO TABS
2.50 | ORAL_TABLET | ORAL | Status: DC
Start: 2018-08-29 — End: 2018-08-28

## 2018-08-28 MED ORDER — SENNA-DOCUSATE SODIUM 8.6-50 MG PO TABS
1.00 | ORAL_TABLET | ORAL | Status: DC
Start: ? — End: 2018-08-28

## 2018-08-28 MED ORDER — METOCLOPRAMIDE HCL 5 MG/ML IJ SOLN
10.00 | INTRAMUSCULAR | Status: DC
Start: ? — End: 2018-08-28

## 2018-08-28 MED ORDER — ENOXAPARIN SODIUM 40 MG/0.4ML ~~LOC~~ SOLN
40.00 | SUBCUTANEOUS | Status: DC
Start: 2018-08-29 — End: 2018-08-28

## 2018-08-28 MED ORDER — NITROGLYCERIN 0.4 MG SL SUBL
0.40 | SUBLINGUAL_TABLET | SUBLINGUAL | Status: DC
Start: ? — End: 2018-08-28

## 2018-08-28 MED ORDER — ISOSORBIDE MONONITRATE ER 30 MG PO TB24
30.00 | ORAL_TABLET | ORAL | Status: DC
Start: 2018-08-29 — End: 2018-08-28

## 2018-08-28 MED ORDER — METOPROLOL TARTRATE 50 MG PO TABS
50.00 | ORAL_TABLET | ORAL | Status: DC
Start: 2018-08-28 — End: 2018-08-28

## 2018-08-28 NOTE — Assessment & Plan Note (Signed)
Doing well on his Zoloft and is requesting a refill today.  Prescription sent

## 2018-08-31 ENCOUNTER — Other Ambulatory Visit: Payer: Self-pay

## 2018-08-31 ENCOUNTER — Ambulatory Visit (INDEPENDENT_AMBULATORY_CARE_PROVIDER_SITE_OTHER): Payer: Medicare Other | Admitting: Family Medicine

## 2018-08-31 VITALS — BP 136/71 | HR 61 | Temp 98.6°F | Wt 164.0 lb

## 2018-08-31 DIAGNOSIS — I2510 Atherosclerotic heart disease of native coronary artery without angina pectoris: Secondary | ICD-10-CM

## 2018-08-31 DIAGNOSIS — R0789 Other chest pain: Secondary | ICD-10-CM | POA: Diagnosis not present

## 2018-08-31 DIAGNOSIS — M6281 Muscle weakness (generalized): Secondary | ICD-10-CM | POA: Diagnosis not present

## 2018-08-31 DIAGNOSIS — M25562 Pain in left knee: Secondary | ICD-10-CM

## 2018-08-31 DIAGNOSIS — L03116 Cellulitis of left lower limb: Secondary | ICD-10-CM | POA: Diagnosis not present

## 2018-08-31 MED ORDER — CLINDAMYCIN HCL 300 MG PO CAPS: 300.0000 mg | ORAL_CAPSULE | Freq: Three times a day (TID) | ORAL | 0 refills | Status: DC

## 2018-08-31 NOTE — Progress Notes (Signed)
Russell Neal is a 78 y.o. male who presents to [facility] today for 1 week Hx of left knee swelling after a fall. Patient has a Hx of bilateral quad weakness and needs a walker to ambulate. Says that he is unable to extend either knee against gravity at baseline. Patient says that he saw a neurologist a number of years ago who told him has a neurological condition causing this weakness, but he did not recall additional info.  Additionally in 2018 he fell and suffered a patellar fracture of his left knee.  This healed without surgery and was able to regain his relatively limited mobility after that.     He also has an erythematous rash surround his lower shin and ankle consistent with cellulitis. He was seen on 08/27/2018 by Dr. Madilyn Fireman who prescribed Doxycycline for presumed cellulitis.  Notes some improvement with Doxycycline but continuing exquisite pain. He has a past Hx of venous statis dermatitis.  Patient visited the ED on 08/27/2018 for chest pain. Worked up for possible ACS. Discharged on Metoprolol and Isosorbide nitrate.  He is feeling much better.  No lightheadedness or dizziness.    ROS:  As above  Exam:  BP 136/71   Pulse 61   Temp 98.6 F (37 C) (Oral)   Wt 164 lb (74.4 kg)   BMI 22.87 kg/m  Wt Readings from Last 5 Encounters:  08/31/18 164 lb (74.4 kg)  08/27/18 163 lb (73.9 kg)  08/17/18 164 lb (74.4 kg)  04/14/18 166 lb (75.3 kg)  03/16/18 167 lb (75.8 kg)   General: Well Developed, well nourished, and in no acute distress.  Neuro/Psych: Alert and oriented x3, extra-ocular muscles intact, able to move all 4 extremities, sensation grossly intact. Skin: Warm and dry, no rashes noted.  Respiratory: Not using accessory muscles, speaking in full sentences, trachea midline.  Cardiovascular: Pulses palpable, no extremity edema. Abdomen: Does not appear distended. MSK: Inability to extend knees against gravity BL. Numerous  ecchymoses along left posterior thigh and posterior shin. Left Knee: Swelling around knee. Mildly tender to palpation along inferior patella. Negative valgus, varus, anterior and posterior drawer.   Left Lower leg: Warm erythematous rash around lower shin, exquisitely tender to palpation.     Lab and Radiology Results Xray report, labs and notes from [facility] ED reviewed.  No acute fractures were found.  Additionally limited musculoskeletal ultrasound of left knee reveals no significant joint effusion.  Quad tendon is intact to the superior portion of the patella.  The patella tendon is intact to the inferior portion of the inferior patella fragment is noted on chronic previous x-rays in the past.  There is some swelling and gap between the superior portion of the patellar fragment in the inferior portion of patellar fragment with no visible tendon continuity on ultrasound.  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   Assessment and Plan: 78 y.o. male with 1 week Hx of left knee swelling and pain after a fall. [place] did not show much effusion. This suggests that swelling could be caused by extravasation of blood that might have since coagulated. US showed discontinuous patellar tendon, likely from previous patellar fracture. Tear of fibrous soft tissue in the patellar tendon is possible but difficult to assess as patient reports that at baseline, he is unable to extend either of his knees, likely due to severe quad weakness. Knee joint aspiration is unlikely to yield much benefit due  to the lack of effusion on [place], altered patellar anatomy, and the risk of infection due to proximity to cellulitis on shin. Continue using the walker and we may consider adding a knee brace for stability.  Painful erythematous rash surrounding lower shin is consistent with cellulitis. While patient reports some improvement, he reports continuing substantial pain suggesting the  infection is not controlled. Continue Doxycycline for one week and prescribing 1 week of Clindamycin to improve coverage.  Possible strep species not well covered by doxycycline.  Patient visited the ED on 08/27/2018 for chest pain. STEMI was ruled out and he is tolerating Metoprolol and Isosorbide Nitrate well and should follow up with Dr. Madilyn Fireman for possible NSTEMI/unstable angina.   Recheck 1 week. I spent 40 minutes with this patient, greater than 50% was face-to-face time counseling regarding ddx and plan and next step. .  Meds ordered this encounter  Medications  . clindamycin (CLEOCIN) 300 MG capsule    Sig: Take 1 capsule (300 mg total) by mouth 3 (three) times daily.    Dispense:  21 capsule    Refill:  0    Historical information moved to improve visibility of documentation.  Past Medical History:  Diagnosis Date  . Hyperlipidemia   . Hypertension    No past surgical history on file. Social History   Tobacco Use  . Smoking status: Former Smoker    Types: Cigarettes    Quit date: 02/18/1973    Years since quitting: 45.5  . Smokeless tobacco: Never Used  Substance Use Topics  . Alcohol use: Yes    Alcohol/week: 2.0 standard drinks    Types: 2 Cans of beer per week   family history is not on file.  Medications: Current Outpatient Medications  Medication Sig Dispense Refill  . metoprolol succinate (TOPROL-XL) 50 MG 24 hr tablet Take by mouth.    [name] acetaminophen (TYLENOL) 500 MG tablet Take by mouth.    [name] aspirin 81 MG tablet Take 81 mg by mouth daily.    . clindamycin (CLEOCIN) 300 MG capsule Take 1 capsule (300 mg total) by mouth 3 (three) times daily. 21 capsule 0  . Cyanocobalamin (B-12 PO) Take by mouth.    . doxycycline (VIBRA-TABS) 100 MG tablet Take 1 tablet (100 mg total) by mouth 2 (two) times daily. 20 tablet 0  . Evolocumab (REPATHA) 140 MG/ML SOSY Inject 140 mg into the skin every 14 (fourteen) days.    . isosorbide mononitrate (IMDUR) 30 MG 24 hr  tablet Take 30 mg by mouth daily.    . methimazole (TAPAZOLE) 5 MG tablet TAKE ONE-HALF TABLET BY MOUTH ONCE DAILY    . Multiple Vitamins-Minerals (CENTRUM SILVER PO) Take 1 capsule by mouth daily.    . NON FORMULARY Dr.Schulzes colon/bowel cleanse.PRN    . Omega-3 Fatty Acids (FISH OIL) 1000 MG CAPS Take 2 capsules by mouth daily.    . sertraline (ZOLOFT) 50 MG tablet Take 1.5 tablets (75 mg total) by mouth daily. 15 tablet 0   No current facility-administered medications for this visit.    Allergies  Allergen Reactions  . Atorvastatin Other (See Comments)    Muscle weakness  . Simvastatin Other (See Comments)    Muscle weakness      Discussed warning signs or symptoms. Please see discharge instructions. Patient expresses understanding.  I personally was present and performed or re-performed the history, physical exam and medical decision-making activities of this service and have verified that the service and findings are accurately  documented in the student's note. ___________________________________________ Lynne Leader M.D., ABFM., CAQSM. Primary Care and Sports Medicine Adjunct Instructor of [employer]

## 2018-08-31 NOTE — Patient Instructions (Signed)
Thank you for coming in today. Continue doxycycline Add Clindamycin for 1 week.  Recheck in 1 week.  I am aftraid that you may have torn the soft tissue connection between the two knee cap pieces in your left knee.    Use the walker.  We may add a knee brace to help stabilize the knee.

## 2018-09-04 DIAGNOSIS — M216X2 Other acquired deformities of left foot: Secondary | ICD-10-CM | POA: Diagnosis not present

## 2018-09-04 DIAGNOSIS — L851 Acquired keratosis [keratoderma] palmaris et plantaris: Secondary | ICD-10-CM | POA: Diagnosis not present

## 2018-09-04 DIAGNOSIS — I70219 Atherosclerosis of native arteries of extremities with intermittent claudication, unspecified extremity: Secondary | ICD-10-CM | POA: Diagnosis not present

## 2018-09-04 DIAGNOSIS — M216X1 Other acquired deformities of right foot: Secondary | ICD-10-CM | POA: Diagnosis not present

## 2018-09-04 DIAGNOSIS — I739 Peripheral vascular disease, unspecified: Secondary | ICD-10-CM | POA: Diagnosis not present

## 2018-09-04 DIAGNOSIS — I872 Venous insufficiency (chronic) (peripheral): Secondary | ICD-10-CM | POA: Diagnosis not present

## 2018-09-04 DIAGNOSIS — R2689 Other abnormalities of gait and mobility: Secondary | ICD-10-CM | POA: Diagnosis not present

## 2018-09-07 ENCOUNTER — Encounter: Payer: Self-pay | Admitting: Family Medicine

## 2018-09-07 ENCOUNTER — Ambulatory Visit (INDEPENDENT_AMBULATORY_CARE_PROVIDER_SITE_OTHER): Payer: Medicare Other | Admitting: Family Medicine

## 2018-09-07 ENCOUNTER — Other Ambulatory Visit: Payer: Self-pay

## 2018-09-07 VITALS — BP 139/72 | HR 54 | Temp 98.2°F | Wt 166.0 lb

## 2018-09-07 DIAGNOSIS — I2510 Atherosclerotic heart disease of native coronary artery without angina pectoris: Secondary | ICD-10-CM

## 2018-09-07 DIAGNOSIS — L03116 Cellulitis of left lower limb: Secondary | ICD-10-CM

## 2018-09-07 DIAGNOSIS — M25562 Pain in left knee: Secondary | ICD-10-CM

## 2018-09-07 DIAGNOSIS — M6281 Muscle weakness (generalized): Secondary | ICD-10-CM

## 2018-09-07 NOTE — Patient Instructions (Signed)
Thank you for coming in today. I think the skin infection is resolved.  Once the antibiotics are done if the pain return let me know and I will extend the antibiotic.  Follow up with the brace people.   Recheck with me as needed.

## 2018-09-07 NOTE — Progress Notes (Signed)
Russell Neal is a 78 y.o. male who presents to [facility] today for follow-up fall and cellulitis.  Patient was seen last week after fall and cellulitis of his left leg.  He has extensive lower extremity weakness and uses a walker to ambulate.  He ambulates by hyperextending his knees.  He had profound quad weakness on exam which he notes is standard for him and unchanged.  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  Additionally he had cellulitis that was not responding to doxycycline.  He was started on clindamycin and notes significant symptom improvement.  He notes the redness and swelling in his left leg has improved quite a bit.  He notes he is able to ambulate a lot better now and is very much happy with how things are going.  He notes he is in the process of reestablishing evaluation for custom bracing or orthotics for his lower extremity weakness including foot drop and quad weakness.    ROS:  As above  Exam:  BP 139/72   Pulse (!) 54   Temp 98.2 F (36.8 C) (Oral)   Wt 166 lb (75.3 kg)   BMI 23.15 kg/m  Wt Readings from Last 5 Encounters:  09/07/18 166 lb (75.3 kg)  08/31/18 164 lb (74.4 kg)  08/27/18 163 lb (73.9 kg)  08/17/18 164 lb (74.4 kg)  04/14/18 166 lb (75.3 kg)   General: Well Developed, well nourished, and in no acute distress.  Neuro/Psych: Alert and oriented x3, extra-ocular muscles intact, able to move all 4 extremities, sensation grossly intact. Skin: Warm and dry, no rashes noted.  Respiratory: Not using accessory muscles, speaking in full sentences, trachea midline.  Cardiovascular: Pulses palpable, no extremity edema. Abdomen: Does not appear distended. MSK:  Left knee slightly swollen.  Not particularly tender.  Normal passive motion.  Significant decreased extension strength.  Calf not particularly erythematous.  Nontender.       Assessment and Plan: 78 y.o. male with  Left leg cellulitis significant improvement with clindamycin.  Complete course.  If symptoms return we will re-establish clindamycin course likely for 1 more week.  Recheck back with PCP.  Additionally patient has significant left leg weakness.  This is a chronic issue.  His orthopedic injury likely is not very much contribution rate to this today.  Plan for continued follow-up for custom orthosis.  Recheck as needed.   PDMP not reviewed this encounter. No orders of the defined types were placed in this encounter.  No orders of the defined types were placed in this encounter.   Historical information moved to improve visibility of documentation.  Past Medical History:  Diagnosis Date  . Hyperlipidemia   . Hypertension    No past surgical history on file. Social History   Tobacco Use  . Smoking status: Former Smoker    Types: Cigarettes    Quit date: 02/18/1973    Years since quitting: 45.5  . Smokeless tobacco: Never Used  Substance Use Topics  . Alcohol use: Yes    Alcohol/week: 2.0 standard drinks    Types: 2 Cans of beer per week   family history is not on file.  Medications: Current Outpatient Medications  Medication Sig Dispense Refill  . acetaminophen (TYLENOL) 500 MG tablet Take by mouth.    [name] aspirin 81 MG tablet Take 81 mg by mouth daily.    . Cyanocobalamin (  B-12 PO) Take by mouth.    . Evolocumab (REPATHA) 140 MG/ML SOSY Inject 140 mg into the skin every 14 (fourteen) days.    . isosorbide mononitrate (IMDUR) 30 MG 24 hr tablet Take 30 mg by mouth daily.    . methimazole (TAPAZOLE) 5 MG tablet TAKE ONE-HALF TABLET BY MOUTH ONCE DAILY    . metoprolol succinate (TOPROL-XL) 50 MG 24 hr tablet Take by mouth.    . Multiple Vitamins-Minerals (CENTRUM SILVER PO) Take 1 capsule by mouth daily.    . NON FORMULARY Dr.Schulzes colon/bowel cleanse.PRN    . Omega-3 Fatty Acids (FISH OIL) 1000 MG CAPS Take 2 capsules by mouth  daily.    . sertraline (ZOLOFT) 50 MG tablet Take 1.5 tablets (75 mg total) by mouth daily. 15 tablet 0  . clindamycin (CLEOCIN) 300 MG capsule Take 1 capsule (300 mg total) by mouth 3 (three) times daily. (Patient not taking: Reported on 09/07/2018) 21 capsule 0  . doxycycline (VIBRA-TABS) 100 MG tablet Take 1 tablet (100 mg total) by mouth 2 (two) times daily. (Patient not taking: Reported on 09/07/2018) 20 tablet 0   No current facility-administered medications for this visit.    Allergies  Allergen Reactions  . Atorvastatin Other (See Comments)    Muscle weakness  . Simvastatin Other (See Comments)    Muscle weakness      Discussed warning signs or symptoms. Please see discharge instructions. Patient expresses understanding.

## 2018-09-10 DIAGNOSIS — E7800 Pure hypercholesterolemia, unspecified: Secondary | ICD-10-CM | POA: Diagnosis not present

## 2018-09-10 DIAGNOSIS — I10 Essential (primary) hypertension: Secondary | ICD-10-CM | POA: Diagnosis not present

## 2018-09-10 DIAGNOSIS — I2510 Atherosclerotic heart disease of native coronary artery without angina pectoris: Secondary | ICD-10-CM | POA: Diagnosis not present

## 2018-09-10 DIAGNOSIS — Z955 Presence of coronary angioplasty implant and graft: Secondary | ICD-10-CM | POA: Diagnosis not present

## 2018-09-16 ENCOUNTER — Other Ambulatory Visit: Payer: Self-pay

## 2018-09-16 ENCOUNTER — Ambulatory Visit (INDEPENDENT_AMBULATORY_CARE_PROVIDER_SITE_OTHER): Payer: Medicare Other | Admitting: Family Medicine

## 2018-09-16 DIAGNOSIS — Z23 Encounter for immunization: Secondary | ICD-10-CM

## 2018-10-21 DIAGNOSIS — D692 Other nonthrombocytopenic purpura: Secondary | ICD-10-CM | POA: Diagnosis not present

## 2018-10-21 DIAGNOSIS — L570 Actinic keratosis: Secondary | ICD-10-CM | POA: Diagnosis not present

## 2018-10-28 NOTE — Progress Notes (Signed)
Subjective:   Russell Neal is a 78 y.o. male who presents for Medicare Annual/Subsequent preventive examination.  Review of Systems:  No ROS.  Medicare Wellness Virtual Visit.  Visual/audio telehealth visit, UTA vital signs.   See social history for additional risk factors.    Cardiac Risk Factors include: advanced age (>51men, >25 women);hypertension;male gender Sleep patterns: Getting 8 hours of sleep a night. Wakes up 1-3 times a night to go void during the night. Wakes up and feels refreshed and ready for the day.   Home Safety/Smoke Alarms: Feels safe in home. Smoke alarms in place.  Living environment;Lives with wife in a 1 story home and no stairs in the home. Shower is a walk in shower and grab bars in place as well as a bench.  Seat Belt Safety/Bike Helmet: Wears seat belt.    Male:   CCS-   Aged out  PSA- No results found for: PSA       Objective:    Vitals: Ht 6\' 1"  (1.854 m)   Wt 166 lb (75.3 kg)   BMI 21.90 kg/m   Body mass index is 21.9 kg/m.  Advanced Directives 11/03/2018 10/28/2017  Does Patient Have a Medical Advance Directive? Yes Yes  Type of Paramedic of Russell Neal;Living will Russell Neal;Living will  Does patient want to make changes to medical advance directive? No - Patient declined Yes (MAU/Ambulatory/Procedural Areas - Information given)  Copy of [place] in Chart? No - copy requested No - copy requested    Tobacco Social History   Tobacco Use  Smoking Status Former Smoker  . Types: Cigarettes  . Quit date: 02/18/1973  . Years since quitting: 45.7  Smokeless Tobacco Never Used     Counseling given: Not Answered   Clinical Intake:  Pre-visit preparation completed: Yes  Pain : No/denies pain     Nutritional Risks: None Diabetes: No  How often do you need to have someone help you when you read instructions, pamphlets, or other written materials from your doctor or  pharmacy?: 1 - Never What is the last grade level you completed in school?: 12  Interpreter Needed?: No  Information entered by :: Russell Dakin, LPN  Past Medical History:  Diagnosis Date  . Hyperlipidemia   . Hypertension    History reviewed. No pertinent surgical history. History reviewed. No pertinent family history. Social History   Socioeconomic History  . Marital status: Married    Spouse name: Russell Neal  . Number of children: 1  . Years of education: 38  . Highest education level: 12th grade  Occupational History  . Occupation: retired    Comment: Nurse, learning disability  Social Needs  . Financial resource strain: Not hard at all  . Food insecurity    Worry: Never true    Inability: Never true  . Transportation needs    Medical: No    Non-medical: No  Tobacco Use  . Smoking status: Former Smoker    Types: Cigarettes    Quit date: 02/18/1973    Years since quitting: 45.7  . Smokeless tobacco: Never Used  Substance and Sexual Activity  . Alcohol use: Yes    Alcohol/week: 2.0 standard drinks    Types: 2 Cans of beer per week  . Drug use: Never  . Sexual activity: Not Currently  Lifestyle  . Physical activity    Days per week: 3 days    Minutes per session: 60 min  . Stress:  Not at all  Relationships  . Social connections    Talks on phone: More than three times a week    Gets together: Twice a week    Attends religious service: Never    Active member of club or organization: No    Attends meetings of clubs or organizations: Never    Relationship status: Married  Other Topics Concern  . Not on file  Social History Narrative   Weight resistance, stationary bike at gym.    Outpatient Encounter Medications as of 11/03/2018  Medication Sig  . acetaminophen (TYLENOL) 500 MG tablet Take by mouth.  [name] aspirin 81 MG tablet Take 81 mg by mouth daily.  . clindamycin (CLEOCIN) 300 MG capsule Take 1 capsule (300 mg total) by mouth 3 (three) times daily.  . Cyanocobalamin  (B-12 PO) Take by mouth.  . Evolocumab (REPATHA) 140 MG/ML SOSY Inject 140 mg into the skin every 14 (fourteen) days.  . isosorbide mononitrate (IMDUR) 30 MG 24 hr tablet Take 30 mg by mouth daily.  . methimazole (TAPAZOLE) 5 MG tablet TAKE ONE-Neal TABLET BY MOUTH ONCE DAILY  . metoprolol succinate (TOPROL-XL) 50 MG 24 hr tablet Take by mouth.  . NON FORMULARY Dr.Schulzes colon/bowel cleanse.PRN  . Omega-3 Fatty Acids (FISH OIL) 1000 MG CAPS Take 2 capsules by mouth daily.  . sertraline (ZOLOFT) 50 MG tablet Take 1 tablet by mouth once daily  . Multiple Vitamins-Minerals (CENTRUM SILVER PO) Take 1 capsule by mouth daily.  . [DISCONTINUED] doxycycline (VIBRA-TABS) 100 MG tablet Take 1 tablet (100 mg total) by mouth 2 (two) times daily. (Patient not taking: Reported on 09/07/2018)  . [DISCONTINUED] sertraline (ZOLOFT) 50 MG tablet Take 1.5 tablets (75 mg total) by mouth daily.   No facility-administered encounter medications on file as of 11/03/2018.     Activities of Daily Living In your present state of health, do you have any difficulty performing the following activities: 11/03/2018  Hearing? Y  Comment hearing loss no worse thant last yeAR  Vision? N  Difficulty concentrating or making decisions? N  Walking or climbing stairs? Y  Comment due to neuropathy in feet  Dressing or bathing? N  Doing errands, shopping? N  Preparing Food and eating ? N  Using the Toilet? N  In the past six months, have you accidently leaked urine? Y  Comment at times patient states  Do you have problems with loss of bowel control? N  Managing your Medications? N  Managing your Finances? N  Housekeeping or managing your Housekeeping? N  Some recent data might be hidden    Patient Care Team: Russell Marry, MD as PCP - General (Family Medicine)   Assessment:   This is a routine wellness examination for Russell Neal.Physical assessment deferred to PCP.   Exercise Activities and Dietary  recommendations Current Exercise Habits: Home exercise routine, Type of exercise: strength training/weights;walking;Other - see comments(biking), Time (Minutes): 60, Frequency (Times/Week): 3, Weekly Exercise (Minutes/Week): 180, Intensity: Moderate Diet Not as healthy as was. Eats out a lot right now. Breakfast: peanut butter and banana on wheat toast with coffee and juice. Lunch: sandwich Dinner: salads, Meat and vegetable or soup.    Drinking 32 ounces of water a day  Goals    . DIET - INCREASE WATER INTAKE     Increase water intake to 64 ounces a day       Fall Risk Fall Risk  11/03/2018 10/28/2017 07/17/2017 10/21/2016 04/19/2016  Falls in the past year? 1 Yes  No No No  Comment - fell at gym but no injury  - - -  Number falls in past yr: 0 1 - - -  Injury with Fall? 0 No - - -  Risk for fall due to : Impaired balance/gait Impaired balance/gait - - -  Follow up - Falls prevention discussed - - -   Is the patient's home free of loose throw rugs in walkways, pet beds, electrical cords, etc?   yes      Grab bars in the bathroom? yes      Handrails on the stairs?   no      Adequate lighting?   yes   Depression Screen PHQ 2/9 Scores 11/03/2018 08/17/2018 10/28/2017 07/17/2017  PHQ - 2 Score 0 6 0 2  PHQ- 9 Score - 9 - 2  Exception Documentation Medical reason - - -    Cognitive Function     6CIT Screen 11/03/2018 10/28/2017  What Year? 0 points 0 points  What month? 0 points 0 points  What time? 0 points 0 points  Count back from 20 0 points 0 points  Months in reverse 0 points 0 points  Repeat phrase 0 points 2 points  Total Score 0 2    Immunization History  Administered Date(s) Administered  . Fluad Quad(high Dose 65+) 09/16/2018  . Influenza, High Dose Seasonal PF 10/13/2015, 10/21/2016  . Influenza, Seasonal, Injecte, Preservative Fre 11/29/2014  . Influenza,inj,Quad PF,6+ Mos 10/28/2017  . Influenza-Unspecified 11/29/2014  . Pneumococcal Conjugate-13 01/16/2017  .  Pneumococcal Polysaccharide-23 01/29/2015  . Tdap 01/29/2015, 08/19/2018  . Zoster Recombinat (Shingrix) 10/22/2016, 02/12/2017    Screening Tests Health Maintenance  Topic Date Due  . TETANUS/TDAP  08/18/2028  . INFLUENZA VACCINE  Completed  . PNA vac Low Risk Adult  Completed        Plan:    Please schedule your next medicare wellness visit with me in 1 yr.  Mr. Achee , Thank you for taking time to come for your Medicare Wellness Visit. I appreciate your ongoing commitment to your health goals. Please review the following plan we discussed and let me know if I can assist you in the future. Continue doing brain stimulating activities (puzzles, reading, adult coloring books, staying active) to keep memory sharp.  Bring a copy of your living will and/or healthcare power of attorney to your next office visit.     These are the goals we discussed: Goals    . DIET - INCREASE WATER INTAKE     Increase water intake to 64 ounces a day       This is a list of the screening recommended for you and due dates:  Health Maintenance  Topic Date Due  . Tetanus Vaccine  08/18/2028  . Flu Shot  Completed  . Pneumonia vaccines  Completed     I have personally reviewed and noted the following in the patient's chart:   . Medical and social history . Use of alcohol, tobacco or illicit drugs  . Current medications and supplements . Functional ability and status . Nutritional status . Physical activity . Advanced directives . List of other physicians . Hospitalizations, surgeries, and ER visits in previous 12 months . Vitals . Screenings to include cognitive, depression, and falls . Referrals and appointments  In addition, I have reviewed and discussed with patient certain preventive protocols, quality metrics, and best practice recommendations. A written personalized care plan for preventive services as well as general preventive health  recommendations were provided to patient.      Joanne Chars, LPN  [id number]

## 2018-11-02 ENCOUNTER — Other Ambulatory Visit: Payer: Self-pay | Admitting: Family Medicine

## 2018-11-03 ENCOUNTER — Ambulatory Visit (INDEPENDENT_AMBULATORY_CARE_PROVIDER_SITE_OTHER): Payer: Medicare Other | Admitting: *Deleted

## 2018-11-03 VITALS — Ht 73.0 in | Wt 166.0 lb

## 2018-11-03 DIAGNOSIS — Z0000 Encounter for general adult medical examination without abnormal findings: Secondary | ICD-10-CM

## 2018-11-03 NOTE — Patient Instructions (Signed)
Please schedule your next medicare wellness visit with me in 1 yr.  Russell Neal , Thank you for taking time to come for your Medicare Wellness Visit. I appreciate your ongoing commitment to your health goals. Please review the following plan we discussed and let me know if I can assist you in the future. Continue doing brain stimulating activities (puzzles, reading, adult coloring books, staying active) to keep memory sharp.  Bring a copy of your living will and/or healthcare power of attorney to your next office visit. These are the goals we discussed: Goals    . DIET - INCREASE WATER INTAKE     Increase water intake to 64 ounces a day

## 2019-01-02 ENCOUNTER — Other Ambulatory Visit: Payer: Self-pay | Admitting: Family Medicine

## 2019-01-18 ENCOUNTER — Telehealth: Payer: Self-pay | Admitting: Family Medicine

## 2019-01-18 NOTE — Telephone Encounter (Signed)
Received a phone call from the wife wanting to make a note to let them know once the Covid-19 vaccine is going to be given to the Elderly. I advised her that we have not been advised where this was available. No other questions at this time.

## 2019-01-28 ENCOUNTER — Telehealth: Payer: Self-pay | Admitting: *Deleted

## 2019-01-28 NOTE — Telephone Encounter (Signed)
Pt's wife left vm earlier today for me to call her back regarding a leg brace for the pt.  I returned her call and was unable to leave her a vm due to her inbox being full.

## 2019-02-02 NOTE — Telephone Encounter (Signed)
Pt is scheduled for an OV with Dr. Dianah Field on 02/03/2019 to discuss the brace.

## 2019-02-03 ENCOUNTER — Encounter: Payer: Self-pay | Admitting: Sports Medicine

## 2019-02-03 ENCOUNTER — Ambulatory Visit (INDEPENDENT_AMBULATORY_CARE_PROVIDER_SITE_OTHER): Payer: Medicare Other | Admitting: Sports Medicine

## 2019-02-03 ENCOUNTER — Other Ambulatory Visit: Payer: Self-pay

## 2019-02-03 DIAGNOSIS — M6281 Muscle weakness (generalized): Secondary | ICD-10-CM | POA: Diagnosis not present

## 2019-02-03 NOTE — Progress Notes (Signed)
    Procedures performed today:    None.  Independent interpretation of tests performed by another provider:   None.  Impression and Recommendations:    Quadriceps weakness Russell Neal returns, he has a peripheral polyneuropathy and has weakness to extension in his arms and legs. This has resulted in loss of ability to extend the left and right knees, foot drop, and significant instability that requires stabilization with an orthosis. He has a varus foot deformity which also requires stabilization in all 3 planes for safety during ambulation.  He requires a custom molded device due to the atrophic nature of his anatomy and due to the need for stabilization of the knee, foot, and ankle in more than one plane.  He would benefit from a stance control feature for improved function and more anatomically correct gait.    ___________________________________________ Russell Neal, M.D., ABFM., CAQSM. Primary Care and [employer] Instructor of [place] [facility]

## 2019-02-03 NOTE — Assessment & Plan Note (Signed)
Russell Neal returns, he has a peripheral polyneuropathy and has weakness to extension in his arms and legs. This has resulted in loss of ability to extend the left and right knees, foot drop, and significant instability that requires stabilization with an orthosis. He has a varus foot deformity which also requires stabilization in all 3 planes for safety during ambulation.  He requires a custom molded device due to the atrophic nature of his anatomy and due to the need for stabilization of the knee, foot, and ankle in more than one plane.  He would benefit from a stance control feature for improved function and more anatomically correct gait.

## 2019-02-08 ENCOUNTER — Other Ambulatory Visit: Payer: Self-pay | Admitting: *Deleted

## 2019-02-08 MED ORDER — SERTRALINE HCL 50 MG PO TABS: 50.0000 mg | ORAL_TABLET | Freq: Every day | ORAL | 1 refills | Status: DC

## 2019-02-11 DIAGNOSIS — E0590 Thyrotoxicosis, unspecified without thyrotoxic crisis or storm: Secondary | ICD-10-CM | POA: Diagnosis not present

## 2019-02-22 DIAGNOSIS — L905 Scar conditions and fibrosis of skin: Secondary | ICD-10-CM | POA: Diagnosis not present

## 2019-02-22 DIAGNOSIS — L570 Actinic keratosis: Secondary | ICD-10-CM | POA: Diagnosis not present

## 2019-02-22 DIAGNOSIS — L218 Other seborrheic dermatitis: Secondary | ICD-10-CM | POA: Diagnosis not present

## 2019-02-25 DIAGNOSIS — I70219 Atherosclerosis of native arteries of extremities with intermittent claudication, unspecified extremity: Secondary | ICD-10-CM | POA: Diagnosis not present

## 2019-02-25 DIAGNOSIS — L97521 Non-pressure chronic ulcer of other part of left foot limited to breakdown of skin: Secondary | ICD-10-CM | POA: Diagnosis not present

## 2019-05-06 DIAGNOSIS — H2513 Age-related nuclear cataract, bilateral: Secondary | ICD-10-CM | POA: Diagnosis not present

## 2019-05-06 DIAGNOSIS — H5203 Hypermetropia, bilateral: Secondary | ICD-10-CM | POA: Diagnosis not present

## 2019-05-06 DIAGNOSIS — H43393 Other vitreous opacities, bilateral: Secondary | ICD-10-CM | POA: Diagnosis not present

## 2019-05-23 ENCOUNTER — Other Ambulatory Visit: Payer: Self-pay

## 2019-05-23 ENCOUNTER — Emergency Department (INDEPENDENT_AMBULATORY_CARE_PROVIDER_SITE_OTHER)
Admission: EM | Admit: 2019-05-23 | Discharge: 2019-05-23 | Disposition: A | Discharge status: Home / Self Care | Payer: Medicare Other | Source: Home / Self Care | Attending: Family Medicine | Admitting: Family Medicine

## 2019-05-23 DIAGNOSIS — H6123 Impacted cerumen, bilateral: Secondary | ICD-10-CM

## 2019-05-23 NOTE — Discharge Instructions (Addendum)
  To prevent recurrent ear wax blockage, try the following: Soak two cotton balls with mineral oil, and gently place in each ear canal once weekly.  Leave the cotton balls in place for 10 to 20 minutes.  This will help liquefy the ear wax and aid your body's normal elimination process.  If applicable, do not use a hearing aid for 8 hours overnight.  Have your ears cleaned by a health professional every 6 to 12 months.  Avoid using "Q-tips" and ear wax softening solutions  

## 2019-05-23 NOTE — ED Provider Notes (Signed)
Russell Neal [facility]    CSN: [id number] Arrival date & time: 05/23/19  1153      History   Chief Complaint Chief Complaint  Patient presents with  . Ear Problem    LT    HPI Russell Neal is a 79 y.o. male.   Patient complains of intermittent stabbing pain and loss of hearing in his left ear today after taking a shower.  He feels well otherwise      The history is provided by the patient.  Otalgia Location:  Left Behind ear:  No abnormality Quality:  Shooting Severity:  Mild Onset quality:  Sudden Duration:  4 hours Timing:  Constant Progression:  Unchanged Chronicity:  New Context: not recent URI   Relieved by:  None tried Worsened by:  Nothing Ineffective treatments:  None tried Associated symptoms: hearing loss   Associated symptoms: no congestion, no ear discharge, no fever, no headaches, no neck pain, no rash, no rhinorrhea, no sore throat and no tinnitus     Past Medical History:  Diagnosis Date  . Hyperlipidemia   . Hypertension     Patient Active Problem List   Diagnosis Date Noted  . Subclinical hyperthyroidism 12/29/2017  . Mood change 12/29/2017  . CAD in native artery 07/17/2017  . PVD (peripheral vascular disease) ([place]) 07/17/2017  . Chronic constipation 06/02/2017  . Complete hearing loss, right 04/19/2016  . Venous stasis dermatitis of both lower extremities 09/29/2015  . Primary osteoarthritis of both knees 05/16/2015  . Hemorrhagic prepatellar bursitis of left knee 02/03/2014  . Quadriceps weakness 02/18/2013  . HYPERLIPIDEMIA 03/23/2008  . Essential hypertension 03/23/2008    History reviewed. No pertinent surgical history.     Home Medications    Prior to Admission medications   Medication Sig Start Date End Date Taking? Authorizing Provider  acetaminophen (TYLENOL) 500 MG tablet Take by mouth.    [provider]  aspirin 81 MG tablet Take 81 mg by mouth daily.    [provider]  clindamycin  (CLEOCIN) 300 MG capsule Take 1 capsule (300 mg total) by mouth 3 (three) times daily. 08/31/18   Gregor Hams, MD  Cyanocobalamin (B-12 PO) Take by mouth.    [provider]  Evolocumab (REPATHA) 140 MG/ML SOSY Inject 140 mg into the skin every 14 (fourteen) days. 04/22/17   [provider]  isosorbide mononitrate (IMDUR) 30 MG 24 hr tablet Take 30 mg by mouth daily. 08/28/18   [provider]  methimazole (TAPAZOLE) 5 MG tablet TAKE ONE-HALF TABLET BY MOUTH ONCE DAILY 10/11/15   [provider]  metoprolol succinate (TOPROL-XL) 50 MG 24 hr tablet Take by mouth. 08/28/18   [provider]  Multiple Vitamins-Minerals (CENTRUM SILVER PO) Take 1 capsule by mouth daily.    [provider]  NON FORMULARY Dr.Schulzes colon/bowel cleanse.PRN    [provider]  Omega-3 Fatty Acids (FISH OIL) 1000 MG CAPS Take 2 capsules by mouth daily.    [provider]  sertraline (ZOLOFT) 50 MG tablet Take 1 tablet (50 mg total) by mouth daily. 02/08/19   Hali Marry, MD    Family History History reviewed. No pertinent family history.  Social History Social History   Tobacco Use  . Smoking status: Former Smoker    Types: Cigarettes    Quit date: 02/18/1973    Years since quitting: 46.2  . Smokeless tobacco: Never Used  Substance Use Topics  . Alcohol use: Yes    Alcohol/week:  2.0 standard drinks    Types: 2 Cans of beer per week  . Drug use: Never     Allergies   Atorvastatin and Simvastatin   Review of Systems Review of Systems  Constitutional: Negative for fever.  HENT: Positive for ear pain and hearing loss. Negative for congestion, ear discharge, rhinorrhea, sore throat and tinnitus.   Musculoskeletal: Negative for neck pain.  Skin: Negative for rash.  Neurological: Negative for headaches.  All other systems reviewed and are negative.    Physical Exam Triage Vital Signs ED Triage Vitals  Enc Vitals Group      BP 05/23/19 1246 129/79     Pulse Rate 05/23/19 1246 (!) 57     Resp --      Temp 05/23/19 1246 98.1 F (36.7 C)     Temp Source 05/23/19 1246 Oral     SpO2 05/23/19 1246 97 %     Weight --      Height --      Head Circumference --      Peak Flow --      Pain Score 05/23/19 1247 0     Pain Loc --      Pain Edu? --      Excl. in [place]? --    No data found.  Updated Vital Signs BP 129/79 (BP Location: Left Arm)   Pulse (!) 57   Temp 98.1 F (36.7 C) (Oral)   SpO2 97%   Visual Acuity Right Eye Distance:   Left Eye Distance:   Bilateral Distance:    Right Eye Near:   Left Eye Near:    Bilateral Near:     Physical Exam Vitals and nursing note reviewed.  Constitutional:      General: He is not in acute distress. HENT:     Head: Normocephalic.     Comments: Post lavage, both canals and tympanic membranes appear normal.    Right Ear: There is impacted cerumen.     Left Ear: There is impacted cerumen.     Nose: Nose normal.     Mouth/Throat:     Pharynx: Oropharynx is clear.  Cardiovascular:     Rate and Rhythm: Normal rate.  Pulmonary:     Effort: Pulmonary effort is normal.  Lymphadenopathy:     Cervical: No cervical adenopathy.  Skin:    General: Skin is warm and dry.  Neurological:     Mental Status: He is alert.      UC Treatments / Results  Labs (all labs ordered are listed, but only abnormal results are displayed) Labs Reviewed - No data to display  EKG   Radiology No results found.  Procedures Procedures  Bilateral ear lavage by nurse  Medications Ordered in UC Medications - No data to display  Initial Impression / Assessment and Plan / UC Course  I have reviewed the triage vital signs and the nursing notes.  Pertinent labs & imaging results that were available during my care of the patient were reviewed by me and considered in my medical decision making (see chart for details).    Return for worsening symptoms    Final Clinical  Impressions(s) / UC Diagnoses   Final diagnoses:  Bilateral impacted cerumen     Discharge Instructions      To prevent recurrent ear wax blockage, try the following: Soak two cotton balls with mineral oil, and gently place in each ear canal once weekly.  Leave the cotton balls in place for  10 to 20 minutes.  This will help liquefy the ear wax and aid your body's normal elimination process.  If applicable, do not use a hearing aid for 8 hours overnight.  Have your ears cleaned by a health professional every 6 to 12 months.  Avoid using "Q-tips" and ear wax softening solutions     ED Prescriptions    None        Kandra Nicolas, MD 05/24/19 1926

## 2019-05-23 NOTE — ED Triage Notes (Signed)
C/o LT ear problem, loss of hearing after shower this morning. Intermittent stabbing pain.

## 2019-06-09 DIAGNOSIS — H527 Unspecified disorder of refraction: Secondary | ICD-10-CM | POA: Diagnosis not present

## 2019-06-09 DIAGNOSIS — H25813 Combined forms of age-related cataract, bilateral: Secondary | ICD-10-CM | POA: Diagnosis not present

## 2019-06-09 DIAGNOSIS — H0223A Paralytic lagophthalmos right eye, upper and lower eyelids: Secondary | ICD-10-CM | POA: Diagnosis not present

## 2019-06-09 DIAGNOSIS — H52203 Unspecified astigmatism, bilateral: Secondary | ICD-10-CM | POA: Diagnosis not present

## 2019-06-09 DIAGNOSIS — H16211 Exposure keratoconjunctivitis, right eye: Secondary | ICD-10-CM | POA: Diagnosis not present

## 2019-06-11 ENCOUNTER — Ambulatory Visit: Payer: Medicare Other | Admitting: Family Medicine

## 2019-06-24 DIAGNOSIS — H52203 Unspecified astigmatism, bilateral: Secondary | ICD-10-CM | POA: Diagnosis not present

## 2019-06-24 DIAGNOSIS — H25813 Combined forms of age-related cataract, bilateral: Secondary | ICD-10-CM | POA: Diagnosis not present

## 2019-07-06 DIAGNOSIS — H25812 Combined forms of age-related cataract, left eye: Secondary | ICD-10-CM | POA: Diagnosis not present

## 2019-07-06 DIAGNOSIS — Z79899 Other long term (current) drug therapy: Secondary | ICD-10-CM | POA: Diagnosis not present

## 2019-07-06 DIAGNOSIS — I10 Essential (primary) hypertension: Secondary | ICD-10-CM | POA: Diagnosis not present

## 2019-07-06 DIAGNOSIS — Z955 Presence of coronary angioplasty implant and graft: Secondary | ICD-10-CM | POA: Diagnosis not present

## 2019-07-06 DIAGNOSIS — H527 Unspecified disorder of refraction: Secondary | ICD-10-CM | POA: Diagnosis not present

## 2019-07-06 DIAGNOSIS — H52203 Unspecified astigmatism, bilateral: Secondary | ICD-10-CM | POA: Diagnosis not present

## 2019-07-06 DIAGNOSIS — Z8673 Personal history of transient ischemic attack (TIA), and cerebral infarction without residual deficits: Secondary | ICD-10-CM | POA: Diagnosis not present

## 2019-07-06 DIAGNOSIS — I38 Endocarditis, valve unspecified: Secondary | ICD-10-CM | POA: Diagnosis not present

## 2019-07-06 DIAGNOSIS — G629 Polyneuropathy, unspecified: Secondary | ICD-10-CM | POA: Diagnosis not present

## 2019-07-06 DIAGNOSIS — H25813 Combined forms of age-related cataract, bilateral: Secondary | ICD-10-CM | POA: Diagnosis not present

## 2019-07-06 DIAGNOSIS — Z833 Family history of diabetes mellitus: Secondary | ICD-10-CM | POA: Diagnosis not present

## 2019-07-06 DIAGNOSIS — E7800 Pure hypercholesterolemia, unspecified: Secondary | ICD-10-CM | POA: Diagnosis not present

## 2019-07-06 DIAGNOSIS — I160 Hypertensive urgency: Secondary | ICD-10-CM | POA: Diagnosis not present

## 2019-07-06 DIAGNOSIS — E0590 Thyrotoxicosis, unspecified without thyrotoxic crisis or storm: Secondary | ICD-10-CM | POA: Diagnosis not present

## 2019-07-06 DIAGNOSIS — K219 Gastro-esophageal reflux disease without esophagitis: Secondary | ICD-10-CM | POA: Diagnosis not present

## 2019-07-06 DIAGNOSIS — I2510 Atherosclerotic heart disease of native coronary artery without angina pectoris: Secondary | ICD-10-CM | POA: Diagnosis not present

## 2019-07-06 DIAGNOSIS — E785 Hyperlipidemia, unspecified: Secondary | ICD-10-CM | POA: Diagnosis not present

## 2019-07-06 DIAGNOSIS — Z87891 Personal history of nicotine dependence: Secondary | ICD-10-CM | POA: Diagnosis not present

## 2019-07-07 DIAGNOSIS — H16211 Exposure keratoconjunctivitis, right eye: Secondary | ICD-10-CM | POA: Diagnosis not present

## 2019-07-07 DIAGNOSIS — H52201 Unspecified astigmatism, right eye: Secondary | ICD-10-CM | POA: Diagnosis not present

## 2019-07-07 DIAGNOSIS — Z961 Presence of intraocular lens: Secondary | ICD-10-CM | POA: Diagnosis not present

## 2019-07-07 DIAGNOSIS — H25811 Combined forms of age-related cataract, right eye: Secondary | ICD-10-CM | POA: Diagnosis not present

## 2019-07-07 DIAGNOSIS — H0223A Paralytic lagophthalmos right eye, upper and lower eyelids: Secondary | ICD-10-CM | POA: Diagnosis not present

## 2019-07-07 DIAGNOSIS — H527 Unspecified disorder of refraction: Secondary | ICD-10-CM | POA: Diagnosis not present

## 2019-07-14 ENCOUNTER — Other Ambulatory Visit: Payer: Self-pay | Admitting: Family Medicine

## 2019-09-13 DIAGNOSIS — Z23 Encounter for immunization: Secondary | ICD-10-CM | POA: Diagnosis not present

## 2019-09-22 DIAGNOSIS — H35372 Puckering of macula, left eye: Secondary | ICD-10-CM | POA: Diagnosis not present

## 2019-09-22 DIAGNOSIS — D3131 Benign neoplasm of right choroid: Secondary | ICD-10-CM | POA: Diagnosis not present

## 2019-09-22 DIAGNOSIS — H527 Unspecified disorder of refraction: Secondary | ICD-10-CM | POA: Diagnosis not present

## 2019-09-22 DIAGNOSIS — Z961 Presence of intraocular lens: Secondary | ICD-10-CM | POA: Diagnosis not present

## 2019-09-22 DIAGNOSIS — H0223A Paralytic lagophthalmos right eye, upper and lower eyelids: Secondary | ICD-10-CM | POA: Diagnosis not present

## 2019-09-22 DIAGNOSIS — H43813 Vitreous degeneration, bilateral: Secondary | ICD-10-CM | POA: Diagnosis not present

## 2019-09-22 DIAGNOSIS — H16211 Exposure keratoconjunctivitis, right eye: Secondary | ICD-10-CM | POA: Diagnosis not present

## 2019-09-28 DIAGNOSIS — E7800 Pure hypercholesterolemia, unspecified: Secondary | ICD-10-CM | POA: Diagnosis not present

## 2019-09-28 DIAGNOSIS — Z955 Presence of coronary angioplasty implant and graft: Secondary | ICD-10-CM | POA: Diagnosis not present

## 2019-09-28 DIAGNOSIS — I2510 Atherosclerotic heart disease of native coronary artery without angina pectoris: Secondary | ICD-10-CM | POA: Diagnosis not present

## 2019-09-28 DIAGNOSIS — I10 Essential (primary) hypertension: Secondary | ICD-10-CM | POA: Diagnosis not present

## 2019-10-08 DIAGNOSIS — Z23 Encounter for immunization: Secondary | ICD-10-CM | POA: Diagnosis not present

## 2019-10-28 DIAGNOSIS — I2510 Atherosclerotic heart disease of native coronary artery without angina pectoris: Secondary | ICD-10-CM | POA: Diagnosis not present

## 2019-10-28 DIAGNOSIS — R410 Disorientation, unspecified: Secondary | ICD-10-CM | POA: Diagnosis not present

## 2019-10-28 DIAGNOSIS — Z9181 History of falling: Secondary | ICD-10-CM | POA: Diagnosis not present

## 2019-10-28 DIAGNOSIS — G629 Polyneuropathy, unspecified: Secondary | ICD-10-CM | POA: Diagnosis not present

## 2019-10-28 DIAGNOSIS — I10 Essential (primary) hypertension: Secondary | ICD-10-CM | POA: Diagnosis not present

## 2019-10-28 DIAGNOSIS — S0093XA Contusion of unspecified part of head, initial encounter: Secondary | ICD-10-CM | POA: Diagnosis not present

## 2019-10-28 DIAGNOSIS — S0003XA Contusion of scalp, initial encounter: Secondary | ICD-10-CM | POA: Diagnosis not present

## 2019-10-28 DIAGNOSIS — I252 Old myocardial infarction: Secondary | ICD-10-CM | POA: Diagnosis not present

## 2019-10-28 DIAGNOSIS — E785 Hyperlipidemia, unspecified: Secondary | ICD-10-CM | POA: Diagnosis not present

## 2019-10-28 DIAGNOSIS — M7989 Other specified soft tissue disorders: Secondary | ICD-10-CM | POA: Diagnosis not present

## 2019-10-28 DIAGNOSIS — Z7982 Long term (current) use of aspirin: Secondary | ICD-10-CM | POA: Diagnosis not present

## 2019-10-28 DIAGNOSIS — G8911 Acute pain due to trauma: Secondary | ICD-10-CM | POA: Diagnosis not present

## 2019-10-28 DIAGNOSIS — Z955 Presence of coronary angioplasty implant and graft: Secondary | ICD-10-CM | POA: Diagnosis not present

## 2019-10-28 DIAGNOSIS — S0990XA Unspecified injury of head, initial encounter: Secondary | ICD-10-CM | POA: Diagnosis not present

## 2019-10-28 DIAGNOSIS — R404 Transient alteration of awareness: Secondary | ICD-10-CM | POA: Diagnosis not present

## 2019-10-28 DIAGNOSIS — R58 Hemorrhage, not elsewhere classified: Secondary | ICD-10-CM | POA: Diagnosis not present

## 2019-10-28 DIAGNOSIS — Z043 Encounter for examination and observation following other accident: Secondary | ICD-10-CM | POA: Diagnosis not present

## 2019-10-28 DIAGNOSIS — G4489 Other headache syndrome: Secondary | ICD-10-CM | POA: Diagnosis not present

## 2019-10-28 DIAGNOSIS — K219 Gastro-esophageal reflux disease without esophagitis: Secondary | ICD-10-CM | POA: Diagnosis not present

## 2019-10-28 DIAGNOSIS — E0590 Thyrotoxicosis, unspecified without thyrotoxic crisis or storm: Secondary | ICD-10-CM | POA: Diagnosis not present

## 2019-10-28 DIAGNOSIS — Z79899 Other long term (current) drug therapy: Secondary | ICD-10-CM | POA: Diagnosis not present

## 2019-10-28 DIAGNOSIS — M7022 Olecranon bursitis, left elbow: Secondary | ICD-10-CM | POA: Diagnosis not present

## 2019-10-28 DIAGNOSIS — Z87891 Personal history of nicotine dependence: Secondary | ICD-10-CM | POA: Diagnosis not present

## 2019-10-28 DIAGNOSIS — Z888 Allergy status to other drugs, medicaments and biological substances status: Secondary | ICD-10-CM | POA: Diagnosis not present

## 2019-11-05 DIAGNOSIS — Z23 Encounter for immunization: Secondary | ICD-10-CM | POA: Diagnosis not present

## 2020-01-17 DIAGNOSIS — L570 Actinic keratosis: Secondary | ICD-10-CM | POA: Diagnosis not present

## 2020-01-17 DIAGNOSIS — D692 Other nonthrombocytopenic purpura: Secondary | ICD-10-CM | POA: Diagnosis not present

## 2020-01-17 DIAGNOSIS — Z85820 Personal history of malignant melanoma of skin: Secondary | ICD-10-CM | POA: Diagnosis not present

## 2020-03-01 ENCOUNTER — Encounter: Payer: Self-pay | Admitting: Family Medicine

## 2020-03-07 DIAGNOSIS — R079 Chest pain, unspecified: Secondary | ICD-10-CM | POA: Diagnosis not present

## 2020-03-07 DIAGNOSIS — I2510 Atherosclerotic heart disease of native coronary artery without angina pectoris: Secondary | ICD-10-CM | POA: Diagnosis not present

## 2020-03-07 DIAGNOSIS — I10 Essential (primary) hypertension: Secondary | ICD-10-CM | POA: Diagnosis not present

## 2020-03-07 DIAGNOSIS — I70219 Atherosclerosis of native arteries of extremities with intermittent claudication, unspecified extremity: Secondary | ICD-10-CM | POA: Diagnosis not present

## 2020-03-28 DIAGNOSIS — E0590 Thyrotoxicosis, unspecified without thyrotoxic crisis or storm: Secondary | ICD-10-CM | POA: Diagnosis not present

## 2020-03-28 DIAGNOSIS — I10 Essential (primary) hypertension: Secondary | ICD-10-CM | POA: Diagnosis not present

## 2020-03-29 DIAGNOSIS — H26493 Other secondary cataract, bilateral: Secondary | ICD-10-CM | POA: Diagnosis not present

## 2020-03-29 DIAGNOSIS — H43813 Vitreous degeneration, bilateral: Secondary | ICD-10-CM | POA: Diagnosis not present

## 2020-05-24 ENCOUNTER — Other Ambulatory Visit: Payer: Self-pay

## 2020-05-24 ENCOUNTER — Telehealth: Payer: Self-pay | Admitting: *Deleted

## 2020-05-24 ENCOUNTER — Ambulatory Visit (INDEPENDENT_AMBULATORY_CARE_PROVIDER_SITE_OTHER): Payer: Medicare Other | Admitting: Family Medicine

## 2020-05-24 ENCOUNTER — Encounter: Payer: Self-pay | Admitting: Family Medicine

## 2020-05-24 VITALS — BP 147/84 | HR 55 | Temp 97.9°F | Ht 73.0 in | Wt 171.0 lb

## 2020-05-24 DIAGNOSIS — I10 Essential (primary) hypertension: Secondary | ICD-10-CM | POA: Diagnosis not present

## 2020-05-24 DIAGNOSIS — F39 Unspecified mood [affective] disorder: Secondary | ICD-10-CM | POA: Diagnosis not present

## 2020-05-24 DIAGNOSIS — I739 Peripheral vascular disease, unspecified: Secondary | ICD-10-CM | POA: Diagnosis not present

## 2020-05-24 DIAGNOSIS — R197 Diarrhea, unspecified: Secondary | ICD-10-CM

## 2020-05-24 NOTE — Assessment & Plan Note (Signed)
He does have peripheral vascular disease but this does not sound consistent with ischemic gut.

## 2020-05-24 NOTE — Assessment & Plan Note (Signed)
Blood pressure is a little elevated today I do want a keep a close eye on this.

## 2020-05-24 NOTE — Progress Notes (Signed)
Established Patient Office Visit  Subjective:  Patient ID: Russell Neal, male    DOB: [birth year]  Age: 80 y.o. MRN: [id number]  CC:  Chief Complaint  Patient presents with  . Stool Color Change    HPI DSHAWN MCNAY presents for problems with BMS. Reports problems for 3-4 years.  He was on Miralax for some time bc of constipation but not he has has more loose stools and he is off Miralax. He is getting watery stools.  He says it happens about once a month where he literally gets most clear watery stools.  Then it starts to turn a color and then finally has more form over days to weeks.    He has bad gas as well.  Wonders if a medication side effect.  They were suspicious particularly of the thyroid medication.  He has noticed certain triggers such as eating salads tend to cause more diarrhea.  His wife also who is here with him today notes that it seems to occur more when he is feeling stressed or anxious.  He has not had any blood in the stool.  No mucus.  He does go to the gym about 3 days/week and typically tries to go early in the mornings he is trying to rush out the door and his wife notices that that sometimes when he has some loose stools or even incontinence at times.   Past Medical History:  Diagnosis Date  . Hyperlipidemia   . Hypertension     History reviewed. No pertinent surgical history.  History reviewed. No pertinent family history.  Social History   Socioeconomic History  . Marital status: Married    Spouse name: Russell Neal  . Number of children: 1  . Years of education: 85  . Highest education level: 12th grade  Occupational History  . Occupation: retired    Comment: Nurse, learning disability  Tobacco Use  . Smoking status: Former Smoker    Types: Cigarettes    Quit date: 02/18/1973    Years since quitting: 47.2  . Smokeless tobacco: Never Used  Vaping Use  . Vaping Use: Never used  Substance and Sexual Activity  . Alcohol use: Yes    Alcohol/week: 2.0 standard  drinks    Types: 2 Cans of beer per week  . Drug use: Never  . Sexual activity: Not Currently  Other Topics Concern  . Not on file  Social History Narrative   Weight resistance, stationary bike at gym.   Social Determinants of Health   Financial Resource Strain: Not on file  Food Insecurity: Not on file  Transportation Needs: Not on file  Physical Activity: Not on file  Stress: Not on file  Social Connections: Not on file  Intimate Partner Violence: Not on file    Outpatient Medications Prior to Visit  Medication Sig Dispense Refill  . acetaminophen (TYLENOL) 500 MG tablet Take by mouth.    [name] aspirin 81 MG tablet Take 81 mg by mouth daily.    . Cyanocobalamin (B-12 PO) Take by mouth.    . Evolocumab 140 MG/ML SOSY Inject 140 mg into the skin every 14 (fourteen) days.    . isosorbide mononitrate (IMDUR) 30 MG 24 hr tablet Take 30 mg by mouth daily.    . methimazole (TAPAZOLE) 5 MG tablet TAKE ONE-Neal TABLET BY MOUTH ONCE DAILY    . metoprolol succinate (TOPROL-XL) 50 MG 24 hr tablet Take by mouth.    . Multiple Vitamins-Minerals (CENTRUM SILVER PO) Take  1 capsule by mouth daily.    . NON FORMULARY Dr.Schulzes colon/bowel cleanse.PRN    . Omega-3 Fatty Acids (FISH OIL) 1000 MG CAPS Take 2 capsules by mouth daily.    . sertraline (ZOLOFT) 50 MG tablet TAKE 1 TABLET BY MOUTH  DAILY 90 tablet 3   No facility-administered medications prior to visit.    Allergies  Allergen Reactions  . Atorvastatin Other (See Comments)    Muscle weakness  . Simvastatin Other (See Comments)    Muscle weakness    ROS Review of Systems    Objective:    Physical Exam Vitals reviewed.  Constitutional:      Appearance: He is well-developed.  HENT:     Head: Normocephalic and atraumatic.  Eyes:     Conjunctiva/sclera: Conjunctivae normal.  Cardiovascular:     Rate and Rhythm: Normal rate.  Pulmonary:     Effort: Pulmonary effort is normal.  Skin:    General: Skin is dry.      Coloration: Skin is not pale.  Neurological:     Mental Status: He is alert and oriented to person, place, and time.  Psychiatric:        Behavior: Behavior normal.     BP (!) 147/84   Pulse (!) 55   Temp 97.9 F (36.6 C)   Ht 6\' 1"  (1.854 m)   Wt 171 lb (77.6 kg)   SpO2 98%   BMI 22.56 kg/m  Wt Readings from Last 3 Encounters:  05/24/20 171 lb (77.6 kg)  02/03/19 168 lb (76.2 kg)  11/03/18 166 lb (75.3 kg)     Health Maintenance Due  Topic Date Due  . COVID-19 Vaccine (2 - Moderna risk 4-dose series) 11/05/2019    There are no preventive care reminders to display for this patient.  No results found for: TSH Lab Results  Component Value Date   WBC 6.9 06/28/2016   HGB 14.8 06/28/2016   HCT 42.3 06/28/2016   MCV 94.6 06/28/2016   PLT 180 06/28/2016   Lab Results  Component Value Date   NA 137 06/28/2016   K 5.0 06/28/2016   CO2 28 06/28/2016   GLUCOSE 89 06/28/2016   BUN 18 06/28/2016   CREATININE 0.50 (L) 06/28/2016   BILITOT 0.6 06/28/2016   ALKPHOS 70 06/28/2016   AST 27 06/28/2016   ALT 31 06/28/2016   PROT 6.6 06/28/2016   ALBUMIN 4.2 06/28/2016   CALCIUM 9.6 06/28/2016   Lab Results  Component Value Date   CHOL 108 10/28/2017   Lab Results  Component Value Date   HDL 32 (L) 10/28/2017   Lab Results  Component Value Date   LDLCALC 51 10/28/2017   Lab Results  Component Value Date   TRIG 170 (H) 10/28/2017   Lab Results  Component Value Date   CHOLHDL 3.4 10/28/2017   Lab Results  Component Value Date   HGBA1C 5.3 10/28/2017      Assessment & Plan:   Problem List Items Addressed This Visit      Cardiovascular and Mediastinum   PVD (peripheral vascular disease) ([place])    He does have peripheral vascular disease but this does not sound consistent with ischemic gut.      Essential hypertension    Blood pressure is a little elevated today I do want a keep a close eye on this.        Other   Unspecified mood (affective)  disorder ([place])    Did discuss possible taper  and weaning of the sertraline just to see if it could be contributing to the diarrhea.  He has been on it for years so again this would have to be tapered.  He will let me know if he decides he wants to do that.       Other Visit Diagnoses    Diarrhea, unspecified type    -  Primary      Loose stools and diarrhea-unclear etiology but it sounds like its been going on for years and may be a little worse more recently.  If there was no clear-cut increase or change over a short period of time.  Since this has been more long-term and there is no blood in the stool then I am less suspicious of an inflammatory bowel disease or an acute infection.  Based on his description of certain food triggers as well as increase in stress causing more diarrhea it actually sounds more consistent with irritable bowel syndrome.  We did discuss that it is a potential diagnosis but also a diagnosis of exclusion.  They want a work on maybe doing some lifestyle changes such as avoiding those trigger foods like the salad.  And maybe even moving back his gym time so he is not feeling stressed and rushed to see if that helps.  We also discussed that out of all of his medications the one most likely to cause diarrhea would actually be his sertraline so that could easily be tapered over 2 weeks and then stopped for a month to see if that is helpful at all or not.  If it does not help that he could always restart it as he has been on this medication for quite some time.  The Repatha and methimazole are much less likely to cause diarrhea.  We again discussed the possibility of stool cultures etc. but again I really do not think that this is infectious.  We also discussed the possibility of food intolerances which could be causing some diarrhea and dumping as well.  Consider testing for milk allergy etc. if symptoms persist.  We also discussed the idea of referral to GI for further work-up he is  particularly worried about the possibility of cancer and I do not think that that would be unreasonable to place referral.  He said he will think about it and let me know.  No orders of the defined types were placed in this encounter.   Follow-up: No follow-ups on file.   I spent 42 minutes on the day of the encounter to include pre-visit record review, face-to-face time with the patient and post visit ordering of test.   Beatrice Lecher, MD

## 2020-05-24 NOTE — Assessment & Plan Note (Signed)
Did discuss possible taper and weaning of the sertraline just to see if it could be contributing to the diarrhea.  He has been on it for years so again this would have to be tapered.  He will let me know if he decides he wants to do that.

## 2020-05-26 NOTE — Telephone Encounter (Signed)
appt scheduled

## 2020-05-28 ENCOUNTER — Other Ambulatory Visit: Payer: Self-pay | Admitting: Family Medicine

## 2020-06-11 ENCOUNTER — Encounter: Payer: Self-pay | Admitting: Emergency Medicine

## 2020-06-11 ENCOUNTER — Emergency Department (INDEPENDENT_AMBULATORY_CARE_PROVIDER_SITE_OTHER)
Admission: EM | Admit: 2020-06-11 | Discharge: 2020-06-11 | Disposition: A | Discharge status: Home / Self Care | Payer: Medicare Other | Source: Home / Self Care

## 2020-06-11 ENCOUNTER — Other Ambulatory Visit: Payer: Self-pay

## 2020-06-11 DIAGNOSIS — H9011 Conductive hearing loss, unilateral, right ear, with unrestricted hearing on the contralateral side: Secondary | ICD-10-CM

## 2020-06-11 DIAGNOSIS — H6123 Impacted cerumen, bilateral: Secondary | ICD-10-CM

## 2020-06-11 NOTE — ED Provider Notes (Signed)
Male Russell Neal [facility]    CSN: [id number] Arrival date & time: 06/11/20  1056      History   Chief Complaint Chief Complaint  Patient presents with  . Ear Fullness    HPI Russell Neal is a 80 y.o. male.   HPI 80 year old male presents with bilateral ear fullness for for 1 day.  Patient has history of bilateral cerumen impaction, he is accompanied by his wife today who reports he has complete hearing loss on his right ear.  Past Medical History:  Diagnosis Date  . Hyperlipidemia   . Hypertension     Patient Active Problem List   Diagnosis Date Noted  . Unspecified mood (affective) disorder ([place]) 05/24/2020  . Subclinical hyperthyroidism 12/29/2017  . Mood change 12/29/2017  . CAD in native artery 07/17/2017  . PVD (peripheral vascular disease) ([place]) 07/17/2017  . Chronic constipation 06/02/2017  . Complete hearing loss, right 04/19/2016  . Venous stasis dermatitis of both lower extremities 09/29/2015  . Primary osteoarthritis of both knees 05/16/2015  . Hemorrhagic prepatellar bursitis of left knee 02/03/2014  . Quadriceps weakness 02/18/2013  . HYPERLIPIDEMIA 03/23/2008  . Essential hypertension 03/23/2008    History reviewed. No pertinent surgical history.     Home Medications    Prior to Admission medications   Medication Sig Start Date End Date Taking? Authorizing Provider  aspirin 81 MG tablet Take 81 mg by mouth daily.   Yes [provider]  Cyanocobalamin (B-12 PO) Take by mouth.   Yes [provider]  methimazole (TAPAZOLE) 5 MG tablet TAKE ONE-HALF TABLET BY MOUTH ONCE DAILY 10/11/15  Yes [provider]  metoprolol succinate (TOPROL-XL) 50 MG 24 hr tablet Take by mouth. 08/28/18  Yes [provider]  Multiple Vitamins-Minerals (CENTRUM SILVER PO) Take 1 capsule by mouth daily.   Yes [provider]  Omega-3 Fatty Acids (FISH OIL) 1000 MG CAPS Take 2 capsules by mouth daily.   Yes [provider]  sertraline (ZOLOFT) 50 MG tablet TAKE 1 TABLET BY MOUTH  DAILY 07/15/19  Yes Hali Marry, MD  acetaminophen (TYLENOL) 500 MG tablet Take by mouth.    [provider]  Evolocumab 140 MG/ML SOSY Inject 140 mg into the skin every 14 (fourteen) days. 04/22/17   [provider]  isosorbide mononitrate (IMDUR) 30 MG 24 hr tablet Take 30 mg by mouth daily. Patient not taking: Reported on 06/11/2020 08/28/18   [provider]  NON FORMULARY Dr.Schulzes colon/bowel cleanse.PRN    [provider]    Family History History reviewed. No pertinent family history.  Social History Social History   Tobacco Use  . Smoking status: Former Smoker    Types: Cigarettes    Quit date: 02/18/1973    Years since quitting: 47.3  . Smokeless tobacco: Never Used  Vaping Use  . Vaping Use: Never used  Substance Use Topics  . Alcohol use: Yes    Alcohol/week: 2.0 standard drinks    Types: 2 Cans of beer per week  . Drug use: Never     Allergies   Atorvastatin and Simvastatin   Review of Systems Review of Systems  Constitutional: Negative.   HENT: Positive for hearing loss.   Eyes: Negative.   Respiratory: Negative.   Cardiovascular: Negative.   Gastrointestinal: Negative.   Genitourinary: Negative.   Musculoskeletal: Negative.   Skin: Negative.   Neurological: Negative.      Physical Exam Triage Vital Signs ED Triage Vitals [06/11/20  1223]  Enc Vitals Group     BP 140/89     Pulse Rate (!) 51     Resp 18     Temp 97.9 F (36.6 C)     Temp Source Oral     SpO2 98 %     Weight      Height      Head Circumference      Peak Flow      Pain Score 0     Pain Loc      Pain Edu?      Excl. in [place]?    No data found.  Updated Vital Signs BP 140/89 (BP Location: Left Arm)   Pulse (!) 51   Temp 97.9 F (36.6 C) (Oral)   Resp 18   SpO2 98%      Physical Exam Vitals and nursing note reviewed.  Constitutional:      Appearance: Normal  appearance.  HENT:     Head: Normocephalic and atraumatic.     Right Ear: There is impacted cerumen.     Left Ear: There is impacted cerumen.     Ears:     Comments: Bilateral EACs with impacted/excessive cerumen unable to visualize TMs.  Post bilateral ear lavage: EAC's are erythematous bilaterally, TM's are cloudy, mildly retracted    Nose: Nose normal.     Comments: Turbinates are erythematous    Mouth/Throat:     Lips: Pink.     Mouth: Mucous membranes are moist.     Pharynx: Oropharynx is clear. Uvula midline. No pharyngeal swelling, posterior oropharyngeal erythema or uvula swelling.  Eyes:     Extraocular Movements: Extraocular movements intact.     Conjunctiva/sclera: Conjunctivae normal.     Pupils: Pupils are equal, round, and reactive to light.  Cardiovascular:     Rate and Rhythm: Normal rate and regular rhythm.     Pulses: Normal pulses.     Heart sounds: Normal heart sounds.  Pulmonary:     Effort: Pulmonary effort is normal.     Breath sounds: Normal breath sounds.     Comments: No adventitious breath sounds noted Musculoskeletal:     Cervical back: Normal range of motion and neck supple. No tenderness.  Lymphadenopathy:     Cervical: No cervical adenopathy.  Skin:    General: Skin is warm and dry.  Neurological:     General: No focal deficit present.     Mental Status: He is alert and oriented to person, place, and time.  Psychiatric:        Mood and Affect: Mood normal.        Behavior: Behavior normal.        Thought Content: Thought content normal.      UC Treatments / Results  Labs (all labs ordered are listed, but only abnormal results are displayed) Labs Reviewed - No data to display  EKG   Radiology No results found.  Procedures Procedures (including critical care time)  Medications Ordered in UC Medications - No data to display  Initial Impression / Assessment and Plan / UC Course  I have reviewed the triage vital signs and the  nursing notes.  Pertinent labs & imaging results that were available during my care of the patient were reviewed by me and considered in my medical decision making (see chart for details).     MDM: Bilateral cerumen impaction.  Patient discharged home, hemodynamically stable. Final Clinical Impressions(s) / UC Diagnoses   Final diagnoses:  Bilateral impacted cerumen  Conductive hearing loss of right ear, unspecified hearing status on contralateral side     Discharge Instructions     Advised patient/wife auditory canal and inner ear are within normal limits, cerumen impaction/excessive cerumen has been removed from both external auditory canals.    ED Prescriptions    None     PDMP not reviewed this encounter.   Eliezer Lofts, [place] 06/11/20 1324

## 2020-06-11 NOTE — Discharge Instructions (Addendum)
Advised patient/wife auditory canal and inner ear are within normal limits, cerumen impaction/excessive cerumen has been removed from both external auditory canals.

## 2020-06-11 NOTE — ED Triage Notes (Signed)
Ear fullness bilat  Pt has a known hearing loss on right No OTC ear gtts Impacted ear wax bilateral  Hearing loss on left this am

## 2020-06-13 ENCOUNTER — Telehealth: Payer: Self-pay | Admitting: Family Medicine

## 2020-06-13 NOTE — Chronic Care Management (AMB) (Signed)
  Chronic Care Management   Note  06/13/2020 Name: Russell Neal MRN: [id number] DOB: 1940/12/05  Russell Neal is a 80 y.o. year old male who is a primary care patient of Madilyn Fireman, Rene Kocher, MD. I reached out to [name] by phone today in response to a referral sent by [name]'s PCP, Hali Marry, MD.   [name] was given information about Chronic Care Management services today including:  1. CCM service includes personalized support from designated clinical staff supervised by his physician, including individualized plan of care and coordination with other care providers 2. 24/7 contact phone numbers for assistance for urgent and routine care needs. 3. Service will only be billed when office clinical staff spend 20 minutes or more in a month to coordinate care. 4. Only one practitioner may furnish and bill the service in a calendar month. 5. The patient may stop CCM services at any time (effective at the end of the month) by phone call to the office staff.   Patient agreed to services and verbal consent obtained.   Follow up plan:   [name] [employer] Scheduler

## 2020-06-30 ENCOUNTER — Ambulatory Visit (INDEPENDENT_AMBULATORY_CARE_PROVIDER_SITE_OTHER): Payer: Medicare Other | Admitting: Pharmacist

## 2020-06-30 ENCOUNTER — Telehealth: Payer: Self-pay | Admitting: *Deleted

## 2020-06-30 DIAGNOSIS — I2510 Atherosclerotic heart disease of native coronary artery without angina pectoris: Secondary | ICD-10-CM

## 2020-06-30 DIAGNOSIS — I739 Peripheral vascular disease, unspecified: Secondary | ICD-10-CM

## 2020-06-30 DIAGNOSIS — I10 Essential (primary) hypertension: Secondary | ICD-10-CM

## 2020-06-30 NOTE — Progress Notes (Signed)
Chronic Care Management Pharmacy Note  06/30/2020 Name:  Russell Neal MRN:  [id number] DOB:  22-Apr-1940  Summary: addressed HTN, HLD.  Patient to let us know if ever experiences affordability issues with evolocumab. Per today's discussion, patient exceeds income threshold for patient assistance program, but may consider trial of medication deescalation if ever becomes an issue.Patient's wife does state they hit the donut hole at times.  Recommendations/Changes made from today's visit: no changes. Based on review of HR/BP at previous visits, in future may consider decreasing metoprolol (low HR), & adding lisinopril 2.42m (SBP 140s). Not checking at home, so uncertain if this is case all the time vs white coat hypertension.  Plan: f/u with pharmacist in 1 year  Subjective: Russell BATESis an 80y.o. year old male who is a primary patient of Russell Neal, CRene Kocher Neal.  The CCM team was consulted for assistance with disease management and care coordination needs.    Engaged with patient by telephone for initial visit in response to provider referral for pharmacy case management and/or care coordination services.   Consent to Services:  The patient was given information about Chronic Care Management services, agreed to services, and gave verbal consent prior to initiation of services.  Please see initial visit note for detailed documentation.   Patient Care Team: Russell Neal as PCP - General (Family Medicine) Russell Neal [facility]ist (Pharmacist)  Recent office visits: 05/24/20- Dr. MMadilyn Fireman(PCP): diarrhea  Recent consult visits: 03/28/20 - JSusette Racer(endocrinology): hyperthyroidism f/u  Hospital visits: 06/11/20 - ED visit for bilateral ear fullness, hearing loss: impacted cerumen  Objective:  Lab Results  Component Value Date   CREATININE 0.50 (L) 06/28/2016   CREATININE 0.48 (L) 02/29/2016    Lab Results  Component Value Date   HGBA1C 5.3 10/28/2017    Last diabetic Eye exam: No results found for: HMDIABEYEEXA  Last diabetic Foot exam: No results found for: HMDIABFOOTEX      Component Value Date/Time   CHOL 108 10/28/2017 1438   TRIG 170 (H) 10/28/2017 1438   HDL 32 (L) 10/28/2017 1438   CHOLHDL 3.4 10/28/2017 1438   LDLCALC 51 10/28/2017 1438    Hepatic Function Latest Ref Rng & Units 06/28/2016  Total Protein 6.1 - 8.1 g/dL 6.6  Albumin 3.6 - 5.1 g/dL 4.2  AST 10 - 35 U/L 27  ALT 9 - 46 U/L 31  Alk Phosphatase 40 - 115 U/L 70  Total Bilirubin 0.2 - 1.2 mg/dL 0.6    No results found for: TSH, FREET4  CBC Latest Ref Rng & Units 06/28/2016  WBC 3.8 - 10.8 K/uL 6.9  Hemoglobin 13.2 - 17.1 g/dL 14.8  Hematocrit 38.5 - 50.0 % 42.3  Platelets 140 - 400 K/uL 180     Social History   Tobacco Use  Smoking Status Former Smoker  . Types: Cigarettes  . Quit date: 02/18/1973  . Years since quitting: 47.3  Smokeless Tobacco Never Used   BP Readings from Last 3 Encounters:  06/11/20 140/89  05/24/20 (!) 147/84  05/23/19 129/79   Pulse Readings from Last 3 Encounters:  06/11/20 (!) 51  05/24/20 (!) 55  05/23/19 (!) 57   Wt Readings from Last 3 Encounters:  05/24/20 171 lb (77.6 kg)  02/03/19 168 lb (76.2 kg)  11/03/18 166 lb (75.3 kg)    Assessment: Review of patient past medical history, allergies, medications, health status, including review of consultants reports, laboratory and other  test data, was performed as part of comprehensive evaluation and provision of chronic care management services.   SDOH:  (Social Determinants of Health) assessments and interventions performed:    CCM Care Plan  Allergies  Allergen Reactions  . Atorvastatin Other (See Comments)    Muscle weakness  . Simvastatin Other (See Comments)    Muscle weakness    Medications Reviewed Today    Reviewed by Darius Neal, [facility] (Pharmacist) on 06/30/20 at [place] List Status: <None>  Medication Order Taking? Sig Documenting Provider  Last Dose Status Informant  acetaminophen (TYLENOL) 500 MG tablet [id number] Yes Take by mouth. Provider, Historical, Neal Taking Active   aspirin 81 MG tablet [id number] Yes Take 81 mg by mouth daily. Provider, Historical, Neal Taking Active   Cyanocobalamin (B-12 PO) [id number] Yes Take by mouth. Provider, Historical, Neal Taking Active   Evolocumab 140 MG/ML SOSY [id number] Yes Inject 140 mg into the skin every 14 (fourteen) days. Provider, Historical, Neal Taking Active   methimazole (TAPAZOLE) 5 MG tablet [id number] Yes TAKE ONE-HALF TABLET BY MOUTH ONCE DAILY Provider, Historical, Neal Taking Active            Med Note Marvel Plan, Linton Rump   Fri Oct 20, 2015  9:50 AM) Received from: [place]  metoprolol succinate (TOPROL-XL) 50 MG 24 hr tablet [id number] Yes Take by mouth. Provider, Historical, Neal Taking Active   Multiple Vitamins-Minerals (CENTRUM SILVER PO) [id number] No Take 1 capsule by mouth daily.  Patient not taking: Reported on 06/30/2020   Provider, Historical, Neal Not Taking Consider Medication Status and Discontinue   NON FORMULARY [id number] No Dr.Schulzes colon/bowel cleanse.PRN  Patient not taking: Reported on 06/30/2020   Provider, Historical, Neal Not Taking Consider Medication Status and Discontinue   Omega-3 Fatty Acids (FISH OIL) 1000 MG CAPS [id number] Yes Take 2 capsules by mouth daily. Provider, Historical, Neal Taking Active   sertraline (ZOLOFT) 50 MG tablet [id number] Yes TAKE 1 TABLET BY MOUTH  DAILY Hali Marry, Neal Taking Active           Patient Active Problem List   Diagnosis Date Noted  . Unspecified mood (affective) disorder ([place]) 05/24/2020  . Subclinical hyperthyroidism 12/29/2017  . Mood change 12/29/2017  . CAD in native artery 07/17/2017  . PVD (peripheral vascular disease) ([place]) 07/17/2017  . Chronic constipation 06/02/2017  . Complete hearing loss, right 04/19/2016  . Venous stasis dermatitis of both lower extremities 09/29/2015  . Primary osteoarthritis  of both knees 05/16/2015  . Hemorrhagic prepatellar bursitis of left knee 02/03/2014  . Quadriceps weakness 02/18/2013  . HYPERLIPIDEMIA 03/23/2008  . Essential hypertension 03/23/2008    Immunization History  Administered Date(s) Administered  . Fluad Quad(high Dose 65+) 09/16/2018  . Influenza, High Dose Seasonal PF 10/13/2015, 10/21/2016  . Influenza, Seasonal, Injecte, Preservative Fre 11/29/2014  . Influenza,inj,Quad PF,6+ Mos 10/28/2017  . Influenza-Unspecified 11/29/2014  . Moderna Sars-Covid-2 Vaccination 10/08/2019  . Pneumococcal Conjugate-13 01/16/2017  . Pneumococcal Polysaccharide-23 01/29/2015  . Tdap 01/29/2015, 08/19/2018  . Zoster Recombinat (Shingrix) 10/22/2016, 02/12/2017    Conditions to be addressed/monitored: HTN and HLD  Care Plan : Medication Management  Updates made by Darius Neal, [place] since 06/30/2020 12:00 AM    Problem: HTN, HLD     Long-Range Goal: Disease Progression Prevention   Note:   Current Barriers:  . None at present  Pharmacist Clinical Goal(s):  [name] Over the next 365 days, patient will maintain control of HTN, HLD as evidenced by  medication adherence,lab values, and vitals at office visits  through collaboration with PharmD and provider.   Interventions: . 1:1 collaboration with Hali Marry, Neal regarding development and update of comprehensive plan of care as evidenced by provider attestation and co-signature . Inter-disciplinary care team collaboration (see longitudinal plan of care) . Comprehensive medication review performed; medication list updated in electronic medical record  Hypertension: . Controlled; current treatment: metoprolol XL 18m daily;  . Current home readings: not checking at home . Denies hypotensive/hypertensive symptoms . Recommended continue current regimen and Hyperlipidemia: . Controlled; current treatment: evolocumab 1434mq14days;  . Marland Kitchenedications previously tried: atorvastatin,  simvastatin . Recommended continue current regimen   Patient Goals/Self-Care Activities . Over the next 365 days, patient will:  take medications as prescribed  Follow Up Plan: Telephone follow up appointment with care management team member scheduled for:  1 year      Medication Assistance: None required.  Patient affirms current coverage meets needs.  [facility]one: [phone]: [phone]  (OpOak CreekCAFowleroNichols HillsSuite [address] [place]hone: [phone]: [phone] pill box? No - keeps a list + cupboard dedicated to medicines Pt endorses 100% compliance  Follow Up:  Patient agrees to Care Plan and Follow-up.  Plan: Telephone follow up appointment with care management team member scheduled for:  1 year  KeDarius Neal

## 2020-06-30 NOTE — Telephone Encounter (Signed)
Pt's wife called and stated that her conversation got cut off from Larinda Buttery  She wanted her to call her back if she wasn't finished with speaking with them at: [phone].  If she was no need to call back

## 2020-06-30 NOTE — Patient Instructions (Signed)
Visit Information   PATIENT GOALS:  Goals Addressed            This Visit's Progress   . Medication Management       Patient Goals/Self-Care Activities . Over the next 365 days, patient will:  take medications as prescribed  Follow Up Plan: Telephone follow up appointment with care management team member scheduled for:  1 year        Consent to CCM Services: Russell Neal was given information about Chronic Care Management services today including:  1. CCM service includes personalized support from designated clinical staff supervised by his physician, including individualized plan of care and coordination with other care providers 2. 24/7 contact phone numbers for assistance for urgent and routine care needs. 3. Service will only be billed when office clinical staff spend 20 minutes or more in a month to coordinate care. 4. Only one practitioner may furnish and bill the service in a calendar month. 5. The patient may stop CCM services at any time (effective at the end of the month) by phone call to the office staff. 6. The patient will be responsible for cost sharing (co-pay) of up to 20% of the service fee (after annual deductible is met).  Patient agreed to services and verbal consent obtained.   Patient verbalizes understanding of instructions provided today and agrees to view in [place].   Telephone follow up appointment with care management team member scheduled for: 1 year  [place] CARE PLAN: Patient Care Plan: Medication Management    Problem Identified: HTN, HLD     Long-Range Goal: Disease Progression Prevention   Note:   Current Barriers:  . None at present  Pharmacist Clinical Goal(s):  [name] Over the next 365 days, patient will maintain control of HTN, HLD as evidenced by medication adherence,lab values, and vitals at office visits  through collaboration with PharmD and provider.   Interventions: . 1:1 collaboration with Russell Marry,  MD regarding development and update of comprehensive plan of care as evidenced by provider attestation and co-signature . Inter-disciplinary care team collaboration (see longitudinal plan of care) . Comprehensive medication review performed; medication list updated in electronic medical record  Hypertension: . Controlled; current treatment: metoprolol XL 32m daily;  . Current home readings: not checking at home . Denies hypotensive/hypertensive symptoms . Recommended continue current regimen  - Based on review of HR/BP at previous visits, in future may consider decreasing metoprolol (low HR), & adding lisinopril 2.576m(SBP 140s). Not checking at home, so uncertain if this is case all the time vs white coat hypertension.  Hyperlipidemia: . Controlled; current treatment: evolocumab 14087m14days;  . MMarland Kitchendications previously tried: atorvastatin, simvastatin . Recommended continue current regimen   Patient Goals/Self-Care Activities . Over the next 365 days, patient will:  take medications as prescribed  Follow Up Plan: Telephone follow up appointment with care management team member scheduled for:  1 year

## 2020-07-10 DIAGNOSIS — L821 Other seborrheic keratosis: Secondary | ICD-10-CM | POA: Diagnosis not present

## 2020-07-10 DIAGNOSIS — I87392 Chronic venous hypertension (idiopathic) with other complications of left lower extremity: Secondary | ICD-10-CM | POA: Diagnosis not present

## 2020-07-10 DIAGNOSIS — L570 Actinic keratosis: Secondary | ICD-10-CM | POA: Diagnosis not present

## 2020-07-10 DIAGNOSIS — I872 Venous insufficiency (chronic) (peripheral): Secondary | ICD-10-CM | POA: Diagnosis not present

## 2020-07-10 DIAGNOSIS — Z85820 Personal history of malignant melanoma of skin: Secondary | ICD-10-CM | POA: Diagnosis not present

## 2020-07-14 DIAGNOSIS — R079 Chest pain, unspecified: Secondary | ICD-10-CM | POA: Diagnosis not present

## 2020-07-14 DIAGNOSIS — S5002XA Contusion of left elbow, initial encounter: Secondary | ICD-10-CM | POA: Diagnosis not present

## 2020-07-14 DIAGNOSIS — E039 Hypothyroidism, unspecified: Secondary | ICD-10-CM | POA: Diagnosis not present

## 2020-07-14 DIAGNOSIS — R109 Unspecified abdominal pain: Secondary | ICD-10-CM | POA: Diagnosis not present

## 2020-07-14 DIAGNOSIS — R0789 Other chest pain: Secondary | ICD-10-CM | POA: Diagnosis not present

## 2020-07-14 DIAGNOSIS — I252 Old myocardial infarction: Secondary | ICD-10-CM | POA: Diagnosis not present

## 2020-07-14 DIAGNOSIS — G8911 Acute pain due to trauma: Secondary | ICD-10-CM | POA: Diagnosis not present

## 2020-07-14 DIAGNOSIS — E785 Hyperlipidemia, unspecified: Secondary | ICD-10-CM | POA: Diagnosis not present

## 2020-07-14 DIAGNOSIS — I739 Peripheral vascular disease, unspecified: Secondary | ICD-10-CM | POA: Diagnosis not present

## 2020-07-14 DIAGNOSIS — M7989 Other specified soft tissue disorders: Secondary | ICD-10-CM | POA: Diagnosis not present

## 2020-07-14 DIAGNOSIS — S51012A Laceration without foreign body of left elbow, initial encounter: Secondary | ICD-10-CM | POA: Diagnosis not present

## 2020-07-14 DIAGNOSIS — I10 Essential (primary) hypertension: Secondary | ICD-10-CM | POA: Diagnosis not present

## 2020-07-14 DIAGNOSIS — S0990XA Unspecified injury of head, initial encounter: Secondary | ICD-10-CM | POA: Diagnosis not present

## 2020-07-17 ENCOUNTER — Ambulatory Visit (INDEPENDENT_AMBULATORY_CARE_PROVIDER_SITE_OTHER): Payer: Medicare Other | Admitting: Physician Assistant

## 2020-07-17 ENCOUNTER — Other Ambulatory Visit: Payer: Self-pay

## 2020-07-17 VITALS — BP 120/84 | HR 54 | Ht 73.0 in | Wt 166.0 lb

## 2020-07-17 DIAGNOSIS — S5002XD Contusion of left elbow, subsequent encounter: Secondary | ICD-10-CM | POA: Diagnosis not present

## 2020-07-17 DIAGNOSIS — K8020 Calculus of gallbladder without cholecystitis without obstruction: Secondary | ICD-10-CM | POA: Diagnosis not present

## 2020-07-17 DIAGNOSIS — I739 Peripheral vascular disease, unspecified: Secondary | ICD-10-CM

## 2020-07-17 DIAGNOSIS — W19XXXD Unspecified fall, subsequent encounter: Secondary | ICD-10-CM

## 2020-07-17 DIAGNOSIS — S51012D Laceration without foreign body of left elbow, subsequent encounter: Secondary | ICD-10-CM

## 2020-07-17 DIAGNOSIS — I2510 Atherosclerotic heart disease of native coronary artery without angina pectoris: Secondary | ICD-10-CM

## 2020-07-17 DIAGNOSIS — N200 Calculus of kidney: Secondary | ICD-10-CM | POA: Diagnosis not present

## 2020-07-17 NOTE — Progress Notes (Signed)
Subjective:    Patient ID: Russell Neal, male    DOB: November 19, 1940, 80 y.o.   MRN: [id number]  HPI Pt is a 80 yo male with HTN, CAD, PVD, hyperthyroidism, mood disorder who presents to the clinic with his wife to follow up from hospital and fall.   Pt fell on 07/14/2020 while getting up he hit the side of his walker and lost balance and fell. He fell on his left elbow and side. He went to ED where imaging was done.   4:49 AM: Radiology studies resulted and noted as: CT Head 1. No acute intracranial hemorrhage. Calvarium is intact.2. Chronic findings as detailed above. CT Chest/abd/pelvis 1. No acute posttraumatic findings in chest, abdomen or pelvis. 2. Mild diffuse esophageal wall thickening may represent esophagitis or other etiologies. Follow-up recommended. 3. Tiny 1 mm nonobstructing right renal stones without hydronephrosis. 4. Cholelithiasis without CT evidence of cholecystitis. ==> actions taken: none  He does see cardiology for PVD, CAD, HTN. He is on metoprolol, ASA and repatha.   He was placed on cephlaxin and pepcid in ED.   .. Active Ambulatory Problems    Diagnosis Date Noted   HYPERLIPIDEMIA 03/23/2008   Essential hypertension 03/23/2008   Quadriceps weakness 02/18/2013   Hemorrhagic prepatellar bursitis of left knee 02/03/2014   Primary osteoarthritis of both knees 05/16/2015   Venous stasis dermatitis of both lower extremities 09/29/2015   Complete hearing loss, right 04/19/2016   Chronic constipation 06/02/2017   CAD in native artery 07/17/2017   PVD (peripheral vascular disease) ([place]) 07/17/2017   Subclinical hyperthyroidism 12/29/2017   Mood change 12/29/2017   Unspecified mood (affective) disorder ([place]) 05/24/2020   Right renal stone 07/17/2020   Cholelithiases 07/17/2020   Skin tear of elbow without complication, left, subsequent encounter 07/18/2020   Contusion of left elbow 07/18/2020   Resolved Ambulatory Problems    Diagnosis Date Noted   Closed  fracture of other part of scapula 03/23/2008   Olecranon bursitis of right elbow 12/30/2013   Needs flu shot 10/13/2015   IFG (impaired fasting glucose) 02/23/2016   Swelling of foot joint, left 06/28/2016   Past Medical History:  Diagnosis Date   Hyperlipidemia    Hypertension         Review of Systems See HPI.     Objective:   Physical Exam Skin:        1 plus pitting edema of feet and legs. Hyperpigmented appearance of bilateral legs.  No significant elbow tenderness. NROM of bilateral arms.         Assessment & Plan:  Marland KitchenMarland KitchenMarkevious was seen today for wound check.  Diagnoses and all orders for this visit:  Fall, subsequent encounter -     Ambulatory referral to [place]  Skin tear of elbow without complication, left, subsequent encounter -     Ambulatory referral to [place] of left elbow, subsequent encounter  Right renal stone  Calculus of gallbladder without cholecystitis without obstruction  PVD (peripheral vascular disease) ([place])  Wound care with xeroform and gauze. Change daily. Can get wet in shower just make sure you pat dry before apply next dressing.  Finish cephalexin. Ok to stop pepcid when finished abx.  Will refer to home health for wound care to keep closer eye on healing and make sure infection is not occurring.  Follow up in 3 weeks with PcP.   Discussed PVD and chronic venous stasis. Only use compression when active lower ext edema.  Discussed presence of gallstones but no sign of inflammation. Do intervention needed unless become symptomatic.   Discussed non obstructing renal stone and no intervention needed until becomes symptomatic.   Discussed fall prevention.   Spent 40 minutes with patient discussing wound and wound care, reviewing chart and ED visit notes with abnormal CT findings, discussing plan to prevent future falls and CV events.

## 2020-07-17 NOTE — Patient Instructions (Addendum)
Stop pepcid when you finish cephalaxin.   Will refer to home health wound care.   Skin Tear A skin tear is a wound in which the top layers of skin have peeled off from the deeper skin or tissues underneath. This is a common problem as people get older because the skin becomes thinner and more fragile. In addition, some medicines, such as oral corticosteroids, can lead to thinning skin if they are taken forlong periods of time. A skin tear is often repaired with tape or skin adhesive strips. Depending on the location of the wound, a bandage (dressing) may be applied over the tape or adhesive strips. Follow these instructions at home: Wound care  Clean the wound as told by your health care provider. You may be instructed to keep the wound dry for the first few days. If you are told to clean the wound: Wash the wound as told by your health care provider. This may include using mild soap and water, a wound cleanser, or a salt-water (saline) solution. If using soap, rinse the wound with water to remove all soap. Do not rub the wound dry. Pat it gently with a clean towel or let it air-dry. Change any dressings as told by your health care provider. This may include changing the dressing if it gets wet, gets dirty, or starts to smell bad. Wash your hands with soap and water for at least 20 seconds before and after you change your bandage (dressing). If soap and water are not available, use hand sanitizer. Leave tape or skin adhesive strips in place. These skin closures may need to stay in place for 2 weeks or longer. If adhesive strip edges start to loosen and curl up, you may trim the loose edges. Do not remove adhesive strips completely unless your health care provider tells you to do that. Check your wound every day for signs of infection. Check for: Redness, swelling, or pain. More fluid or blood. Warmth. Pus or a bad smell. Do not scratch or pick at the wound. Protect the injured area until it  has healed.  Medicines Take or apply over-the-counter and prescription medicines only as told by your health care provider. If you were prescribed an antibiotic medicine, take or apply it as told by your health care provider. Do not stop using the antibiotic even if your condition improves. General instructions  Keep the dressing dry as told by your health care provider. Do not take baths, swim, use a hot tub, or do anything that puts your wound underwater until your health care provider approves. Ask your health care provider if you may take showers. You may only be allowed to take sponge baths. Keep all follow-up visits. This is important.  Contact a health care provider if: You have redness, swelling, or pain around your wound. You have more fluid or blood coming from your wound. Your wound, or the area around your wound, feels warm to the touch. You have pus or a bad smell coming from your wound. Get help right away if: You have a red streak that goes away from the skin tear. You have a fever and chills, and your symptoms suddenly get worse. Summary A skin tear is a wound in which the top layers of skin have peeled off from the deeper skin or tissues underneath. A skin tear is often repaired with tape or skin adhesive strips, and a bandage (dressing) may be applied over the tape or the adhesive strips. Change any dressings  as told by your health care provider. Take or apply over-the-counter and prescription medicines only as told by your health care provider. Contact a health care provider if you have signs of infection. This information is not intended to replace advice given to you by your health care provider. Make sure you discuss any questions you have with your healthcare provider. Document Revised: 04/21/2019 Document Reviewed: 04/21/2019 Elsevier Patient Education  [place].

## 2020-07-18 ENCOUNTER — Encounter: Payer: Self-pay | Admitting: Physician Assistant

## 2020-07-18 DIAGNOSIS — S5002XA Contusion of left elbow, initial encounter: Secondary | ICD-10-CM | POA: Insufficient documentation

## 2020-07-18 DIAGNOSIS — S51012D Laceration without foreign body of left elbow, subsequent encounter: Secondary | ICD-10-CM | POA: Insufficient documentation

## 2020-07-19 DIAGNOSIS — I051 Rheumatic mitral insufficiency: Secondary | ICD-10-CM | POA: Diagnosis not present

## 2020-07-19 DIAGNOSIS — I2510 Atherosclerotic heart disease of native coronary artery without angina pectoris: Secondary | ICD-10-CM | POA: Diagnosis not present

## 2020-07-19 DIAGNOSIS — I739 Peripheral vascular disease, unspecified: Secondary | ICD-10-CM | POA: Diagnosis not present

## 2020-07-19 DIAGNOSIS — M170 Bilateral primary osteoarthritis of knee: Secondary | ICD-10-CM | POA: Diagnosis not present

## 2020-07-19 DIAGNOSIS — Z9181 History of falling: Secondary | ICD-10-CM | POA: Diagnosis not present

## 2020-07-19 DIAGNOSIS — Z79899 Other long term (current) drug therapy: Secondary | ICD-10-CM | POA: Diagnosis not present

## 2020-07-19 DIAGNOSIS — I10 Essential (primary) hypertension: Secondary | ICD-10-CM | POA: Diagnosis not present

## 2020-07-19 DIAGNOSIS — H9191 Unspecified hearing loss, right ear: Secondary | ICD-10-CM | POA: Diagnosis not present

## 2020-07-19 DIAGNOSIS — K219 Gastro-esophageal reflux disease without esophagitis: Secondary | ICD-10-CM | POA: Diagnosis not present

## 2020-07-19 DIAGNOSIS — I872 Venous insufficiency (chronic) (peripheral): Secondary | ICD-10-CM | POA: Diagnosis not present

## 2020-07-19 DIAGNOSIS — S51812D Laceration without foreign body of left forearm, subsequent encounter: Secondary | ICD-10-CM | POA: Diagnosis not present

## 2020-07-19 DIAGNOSIS — E785 Hyperlipidemia, unspecified: Secondary | ICD-10-CM | POA: Diagnosis not present

## 2020-07-19 DIAGNOSIS — E02 Subclinical iodine-deficiency hypothyroidism: Secondary | ICD-10-CM | POA: Diagnosis not present

## 2020-07-19 DIAGNOSIS — Z8674 Personal history of sudden cardiac arrest: Secondary | ICD-10-CM | POA: Diagnosis not present

## 2020-07-19 DIAGNOSIS — K8020 Calculus of gallbladder without cholecystitis without obstruction: Secondary | ICD-10-CM | POA: Diagnosis not present

## 2020-07-19 DIAGNOSIS — K5909 Other constipation: Secondary | ICD-10-CM | POA: Diagnosis not present

## 2020-07-19 DIAGNOSIS — G629 Polyneuropathy, unspecified: Secondary | ICD-10-CM | POA: Diagnosis not present

## 2020-07-19 DIAGNOSIS — N200 Calculus of kidney: Secondary | ICD-10-CM | POA: Diagnosis not present

## 2020-07-19 DIAGNOSIS — E0590 Thyrotoxicosis, unspecified without thyrotoxic crisis or storm: Secondary | ICD-10-CM | POA: Diagnosis not present

## 2020-07-19 DIAGNOSIS — N529 Male erectile dysfunction, unspecified: Secondary | ICD-10-CM | POA: Diagnosis not present

## 2020-07-19 DIAGNOSIS — F39 Unspecified mood [affective] disorder: Secondary | ICD-10-CM | POA: Diagnosis not present

## 2020-07-21 DIAGNOSIS — I872 Venous insufficiency (chronic) (peripheral): Secondary | ICD-10-CM | POA: Diagnosis not present

## 2020-07-21 DIAGNOSIS — S51812D Laceration without foreign body of left forearm, subsequent encounter: Secondary | ICD-10-CM | POA: Diagnosis not present

## 2020-07-21 DIAGNOSIS — M170 Bilateral primary osteoarthritis of knee: Secondary | ICD-10-CM | POA: Diagnosis not present

## 2020-07-21 DIAGNOSIS — I739 Peripheral vascular disease, unspecified: Secondary | ICD-10-CM | POA: Diagnosis not present

## 2020-07-21 DIAGNOSIS — I10 Essential (primary) hypertension: Secondary | ICD-10-CM | POA: Diagnosis not present

## 2020-07-21 DIAGNOSIS — I2510 Atherosclerotic heart disease of native coronary artery without angina pectoris: Secondary | ICD-10-CM | POA: Diagnosis not present

## 2020-07-25 DIAGNOSIS — M170 Bilateral primary osteoarthritis of knee: Secondary | ICD-10-CM | POA: Diagnosis not present

## 2020-07-25 DIAGNOSIS — I10 Essential (primary) hypertension: Secondary | ICD-10-CM | POA: Diagnosis not present

## 2020-07-25 DIAGNOSIS — S51812D Laceration without foreign body of left forearm, subsequent encounter: Secondary | ICD-10-CM | POA: Diagnosis not present

## 2020-07-25 DIAGNOSIS — I739 Peripheral vascular disease, unspecified: Secondary | ICD-10-CM | POA: Diagnosis not present

## 2020-07-25 DIAGNOSIS — I2510 Atherosclerotic heart disease of native coronary artery without angina pectoris: Secondary | ICD-10-CM | POA: Diagnosis not present

## 2020-07-25 DIAGNOSIS — I872 Venous insufficiency (chronic) (peripheral): Secondary | ICD-10-CM | POA: Diagnosis not present

## 2020-07-28 DIAGNOSIS — I2510 Atherosclerotic heart disease of native coronary artery without angina pectoris: Secondary | ICD-10-CM | POA: Diagnosis not present

## 2020-07-28 DIAGNOSIS — S51812D Laceration without foreign body of left forearm, subsequent encounter: Secondary | ICD-10-CM | POA: Diagnosis not present

## 2020-07-28 DIAGNOSIS — I739 Peripheral vascular disease, unspecified: Secondary | ICD-10-CM | POA: Diagnosis not present

## 2020-07-28 DIAGNOSIS — I872 Venous insufficiency (chronic) (peripheral): Secondary | ICD-10-CM | POA: Diagnosis not present

## 2020-07-28 DIAGNOSIS — I10 Essential (primary) hypertension: Secondary | ICD-10-CM | POA: Diagnosis not present

## 2020-07-28 DIAGNOSIS — M170 Bilateral primary osteoarthritis of knee: Secondary | ICD-10-CM | POA: Diagnosis not present

## 2020-07-30 DIAGNOSIS — M170 Bilateral primary osteoarthritis of knee: Secondary | ICD-10-CM | POA: Diagnosis not present

## 2020-07-30 DIAGNOSIS — S51812D Laceration without foreign body of left forearm, subsequent encounter: Secondary | ICD-10-CM | POA: Diagnosis not present

## 2020-07-30 DIAGNOSIS — I739 Peripheral vascular disease, unspecified: Secondary | ICD-10-CM | POA: Diagnosis not present

## 2020-07-30 DIAGNOSIS — I10 Essential (primary) hypertension: Secondary | ICD-10-CM | POA: Diagnosis not present

## 2020-07-30 DIAGNOSIS — I2510 Atherosclerotic heart disease of native coronary artery without angina pectoris: Secondary | ICD-10-CM | POA: Diagnosis not present

## 2020-07-30 DIAGNOSIS — I872 Venous insufficiency (chronic) (peripheral): Secondary | ICD-10-CM | POA: Diagnosis not present

## 2020-08-04 DIAGNOSIS — I2510 Atherosclerotic heart disease of native coronary artery without angina pectoris: Secondary | ICD-10-CM | POA: Diagnosis not present

## 2020-08-04 DIAGNOSIS — I10 Essential (primary) hypertension: Secondary | ICD-10-CM | POA: Diagnosis not present

## 2020-08-04 DIAGNOSIS — S51812D Laceration without foreign body of left forearm, subsequent encounter: Secondary | ICD-10-CM | POA: Diagnosis not present

## 2020-08-04 DIAGNOSIS — I872 Venous insufficiency (chronic) (peripheral): Secondary | ICD-10-CM | POA: Diagnosis not present

## 2020-08-04 DIAGNOSIS — I739 Peripheral vascular disease, unspecified: Secondary | ICD-10-CM | POA: Diagnosis not present

## 2020-08-04 DIAGNOSIS — M170 Bilateral primary osteoarthritis of knee: Secondary | ICD-10-CM | POA: Diagnosis not present

## 2020-08-07 ENCOUNTER — Ambulatory Visit (INDEPENDENT_AMBULATORY_CARE_PROVIDER_SITE_OTHER): Payer: Medicare Other | Admitting: Family Medicine

## 2020-08-07 ENCOUNTER — Other Ambulatory Visit: Payer: Self-pay

## 2020-08-07 DIAGNOSIS — S51012D Laceration without foreign body of left elbow, subsequent encounter: Secondary | ICD-10-CM

## 2020-08-07 DIAGNOSIS — W19XXXD Unspecified fall, subsequent encounter: Secondary | ICD-10-CM

## 2020-08-07 DIAGNOSIS — K2289 Other specified disease of esophagus: Secondary | ICD-10-CM | POA: Diagnosis not present

## 2020-08-07 DIAGNOSIS — I2510 Atherosclerotic heart disease of native coronary artery without angina pectoris: Secondary | ICD-10-CM | POA: Diagnosis not present

## 2020-08-07 NOTE — Progress Notes (Signed)
Established Patient Office Visit  Subjective:  Patient ID: Russell Neal, male    DOB: [birth year]  Age: 80 y.o. MRN: [id number]  CC: No chief complaint on file.   HPI Russell Neal presents for follow-up recent fall from around June Neal.  Russell fell and hit the side of his walker.  Russell went to the emergency department.  Head CT was negative.  Chest CT did not show any active bleeding.  Russell was noticed to have some diffuse esophageal wall thickening.  Russell says Russell occasionally gets reflux but its not very often.  When Russell does Russell will try to go outside and sometimes try to belch.  Russell does to sleep on 3 pillows but Russell says Russell does that more for drainage Russell has been doing that for years.  Follow-up wound care on his left elbow Russell says is actually doing well the home health nurse has been coming out regularly and Russell has been doing great. Occ has problems swallowing pills in the neck region. Not often. Can usually drink fluid and get it to pass.    Past Medical History:  Diagnosis Date   Hyperlipidemia    Hypertension     History reviewed. No pertinent surgical history.  History reviewed. No pertinent family history.  Social History   Socioeconomic History   Marital status: Married    Spouse name: Russell Neal   Number of children: 1   Years of education: 12   Highest education level: 12th grade  Occupational History   Occupation: retired    Comment: Nurse, learning disability  Tobacco Use   Smoking status: Former    Pack years: 0.00    Types: Cigarettes    Quit date: 02/18/1973    Years since quitting: 47.5   Smokeless tobacco: Never  Vaping Use   Vaping Use: Never used  Substance and Sexual Activity   Alcohol use: Yes    Alcohol/week: 2.0 standard drinks    Types: 2 Cans of beer per week   Drug use: Never   Sexual activity: Not Currently  Other Topics Concern   Not on file  Social History Narrative   Weight resistance, stationary bike at gym.   Social Determinants of Health   Financial  Resource Strain: Not on file  Food Insecurity: Not on file  Transportation Needs: Not on file  Physical Activity: Not on file  Stress: Not on file  Social Connections: Not on file  Intimate Partner Violence: Not on file    Outpatient Medications Prior to Visit  Medication Sig Dispense Refill   aspirin 81 MG tablet Take 81 mg by mouth daily.     Cyanocobalamin (B-12 PO) Take by mouth.     Evolocumab 140 MG/ML SOSY Inject 140 mg into the skin every 14 (fourteen) days.     famotidine (PEPCID) 20 MG tablet Take 20 mg by mouth 2 (two) times daily.     methimazole (TAPAZOLE) 5 MG tablet TAKE ONE-Neal TABLET BY MOUTH ONCE DAILY     metoprolol succinate (TOPROL-XL) 50 MG 24 hr tablet Take by mouth.     Multiple Vitamins-Minerals (CENTRUM SILVER PO) Take 1 capsule by mouth daily.     NON FORMULARY      Omega-3 Fatty Acids (FISH OIL) 1000 MG CAPS Take 2 capsules by mouth daily.     sertraline (ZOLOFT) 50 MG tablet TAKE 1 TABLET BY MOUTH  DAILY 90 tablet 3   cephALEXin (KEFLEX) 500 MG capsule Take 500 mg by  mouth 3 (three) times daily.     No facility-administered medications prior to visit.    Allergies  Allergen Reactions   Atorvastatin Other (See Comments)    Muscle weakness   Simvastatin Other (See Comments)    Muscle weakness    ROS Review of Systems    Objective:    Physical Exam Constitutional:      Appearance: Normal appearance. Russell is well-developed.  HENT:     Head: Normocephalic and atraumatic.  Cardiovascular:     Rate and Rhythm: Normal rate and regular rhythm.     Heart sounds: Normal heart sounds.  Pulmonary:     Effort: Pulmonary effort is normal.     Breath sounds: Normal breath sounds.  Skin:    General: Skin is warm and dry.  Neurological:     Mental Status: Russell is alert and oriented to person, place, and time. Mental status is at baseline.  Psychiatric:        Behavior: Behavior normal.    There were no vitals taken for this visit. Wt Readings from  Last 3 Encounters:  07/17/20 166 lb (75.3 kg)  05/24/20 171 lb (77.6 kg)  02/03/19 168 lb (76.2 kg)     Health Maintenance Due  Topic Date Due   COVID-19 Vaccine (3 - Moderna risk series) 06/04/2020    There are no preventive care reminders to display for this patient.  No results found for: TSH Lab Results  Component Value Date   WBC 6.9 06/28/2016   HGB 14.8 06/28/2016   HCT 42.3 06/28/2016   MCV 94.6 06/28/2016   PLT 180 06/28/2016   Lab Results  Component Value Date   NA 137 06/28/2016   K 5.0 06/28/2016   CO2 28 06/28/2016   GLUCOSE 89 06/28/2016   BUN 18 06/28/2016   CREATININE 0.50 (L) 06/28/2016   BILITOT 0.6 06/28/2016   ALKPHOS 70 06/28/2016   AST 27 06/28/2016   ALT 31 06/28/2016   PROT 6.6 06/28/2016   ALBUMIN 4.2 06/28/2016   CALCIUM 9.6 06/28/2016   Lab Results  Component Value Date   CHOL 108 10/28/2017   Lab Results  Component Value Date   HDL 32 (L) 10/28/2017   Lab Results  Component Value Date   LDLCALC 51 10/28/2017   Lab Results  Component Value Date   TRIG 170 (H) 10/28/2017   Lab Results  Component Value Date   CHOLHDL 3.4 10/28/2017   Lab Results  Component Value Date   HGBA1C 5.3 10/28/2017      Assessment & Plan:   Problem List Items Addressed This Visit       Musculoskeletal and Integument   Skin tear of elbow without complication, left, subsequent encounter   Other Visit Diagnoses     Fall, subsequent encounter    -  Primary   Esophageal thickening       Relevant Orders   Ambulatory referral to Gastroenterology      Esophageal thickening-we will place referral to local GI.  Unclear if this just may be secondary to some inflammation or if it may be more significant but I do think we should evaluate it further. Russell does take Pepcid.    Skin tear-healing very nicely.  Wound is clean dry and intact.  Russell is really done a great job and doing his wound care.  The nurse should be coming back out on Friday to  recheck it and if everything is clear at that point Russell should  not need further attention to the wound.  Fall-unfortunately Russell is high risk for falls because of his gait instability.  Russell does use his walker.  No orders of the defined types were placed in this encounter.   Follow-up: Return in about 6 months (around 02/07/2021) for mood/bp.    Beatrice Lecher, MD

## 2020-08-09 ENCOUNTER — Encounter: Payer: Self-pay | Admitting: Family Medicine

## 2020-08-09 DIAGNOSIS — S51812D Laceration without foreign body of left forearm, subsequent encounter: Secondary | ICD-10-CM | POA: Diagnosis not present

## 2020-08-09 DIAGNOSIS — I739 Peripheral vascular disease, unspecified: Secondary | ICD-10-CM | POA: Diagnosis not present

## 2020-08-09 DIAGNOSIS — I872 Venous insufficiency (chronic) (peripheral): Secondary | ICD-10-CM | POA: Diagnosis not present

## 2020-08-09 DIAGNOSIS — I10 Essential (primary) hypertension: Secondary | ICD-10-CM | POA: Diagnosis not present

## 2020-08-09 DIAGNOSIS — M170 Bilateral primary osteoarthritis of knee: Secondary | ICD-10-CM | POA: Diagnosis not present

## 2020-08-09 DIAGNOSIS — I2510 Atherosclerotic heart disease of native coronary artery without angina pectoris: Secondary | ICD-10-CM | POA: Diagnosis not present

## 2020-08-11 DIAGNOSIS — I2510 Atherosclerotic heart disease of native coronary artery without angina pectoris: Secondary | ICD-10-CM | POA: Diagnosis not present

## 2020-08-11 DIAGNOSIS — I739 Peripheral vascular disease, unspecified: Secondary | ICD-10-CM | POA: Diagnosis not present

## 2020-08-11 DIAGNOSIS — S51812D Laceration without foreign body of left forearm, subsequent encounter: Secondary | ICD-10-CM | POA: Diagnosis not present

## 2020-08-11 DIAGNOSIS — I10 Essential (primary) hypertension: Secondary | ICD-10-CM | POA: Diagnosis not present

## 2020-08-11 DIAGNOSIS — I872 Venous insufficiency (chronic) (peripheral): Secondary | ICD-10-CM | POA: Diagnosis not present

## 2020-08-11 DIAGNOSIS — M170 Bilateral primary osteoarthritis of knee: Secondary | ICD-10-CM | POA: Diagnosis not present

## 2020-08-14 DIAGNOSIS — L03032 Cellulitis of left toe: Secondary | ICD-10-CM | POA: Diagnosis not present

## 2020-08-14 DIAGNOSIS — L84 Corns and callosities: Secondary | ICD-10-CM | POA: Diagnosis not present

## 2020-08-14 DIAGNOSIS — I10 Essential (primary) hypertension: Secondary | ICD-10-CM | POA: Diagnosis not present

## 2020-08-14 DIAGNOSIS — I739 Peripheral vascular disease, unspecified: Secondary | ICD-10-CM | POA: Diagnosis not present

## 2020-08-15 DIAGNOSIS — I739 Peripheral vascular disease, unspecified: Secondary | ICD-10-CM | POA: Diagnosis not present

## 2020-08-15 DIAGNOSIS — I2510 Atherosclerotic heart disease of native coronary artery without angina pectoris: Secondary | ICD-10-CM | POA: Diagnosis not present

## 2020-08-15 DIAGNOSIS — I10 Essential (primary) hypertension: Secondary | ICD-10-CM | POA: Diagnosis not present

## 2020-08-15 DIAGNOSIS — I872 Venous insufficiency (chronic) (peripheral): Secondary | ICD-10-CM | POA: Diagnosis not present

## 2020-08-15 DIAGNOSIS — S51812D Laceration without foreign body of left forearm, subsequent encounter: Secondary | ICD-10-CM | POA: Diagnosis not present

## 2020-08-15 DIAGNOSIS — M170 Bilateral primary osteoarthritis of knee: Secondary | ICD-10-CM | POA: Diagnosis not present

## 2020-08-16 DIAGNOSIS — R151 Fecal smearing: Secondary | ICD-10-CM | POA: Diagnosis not present

## 2020-08-16 DIAGNOSIS — R933 Abnormal findings on diagnostic imaging of other parts of digestive tract: Secondary | ICD-10-CM | POA: Diagnosis not present

## 2020-08-16 DIAGNOSIS — K5900 Constipation, unspecified: Secondary | ICD-10-CM | POA: Diagnosis not present

## 2020-08-16 DIAGNOSIS — R194 Change in bowel habit: Secondary | ICD-10-CM | POA: Diagnosis not present

## 2020-08-16 DIAGNOSIS — I10 Essential (primary) hypertension: Secondary | ICD-10-CM | POA: Diagnosis not present

## 2020-08-21 DIAGNOSIS — K6389 Other specified diseases of intestine: Secondary | ICD-10-CM | POA: Diagnosis not present

## 2020-08-21 DIAGNOSIS — K5900 Constipation, unspecified: Secondary | ICD-10-CM | POA: Diagnosis not present

## 2020-09-11 ENCOUNTER — Other Ambulatory Visit: Payer: Self-pay

## 2020-09-21 ENCOUNTER — Ambulatory Visit: Payer: Medicare Other | Admitting: Family Medicine

## 2020-09-22 ENCOUNTER — Ambulatory Visit (INDEPENDENT_AMBULATORY_CARE_PROVIDER_SITE_OTHER): Payer: Medicare Other | Admitting: Family Medicine

## 2020-09-22 DIAGNOSIS — Z0000 Encounter for general adult medical examination without abnormal findings: Secondary | ICD-10-CM

## 2020-09-22 NOTE — Progress Notes (Signed)
MEDICARE ANNUAL WELLNESS VISIT  09/22/2020  Telephone Visit Disclaimer This Medicare AWV was conducted by telephone due to national recommendations for restrictions regarding the COVID-19 Pandemic (e.g. social distancing).  I verified, using two identifiers, that I am speaking with Russell Neal or their authorized healthcare agent. I discussed the limitations, risks, security, and privacy concerns of performing an evaluation and management service by telephone and the potential availability of an in-person appointment in the future. The patient expressed understanding and agreed to proceed.  Location of Patient: Home with wife Location of Provider (nurse):  In the office.  Subjective:    Russell Neal is a 80 y.o. male patient of [name], Rene Kocher, MD who had a Medicare Annual Wellness Visit today via telephone. Bron is Retired and lives with their spouse. he has 1 children. he reports that he is socially active and does interact with friends/family regularly. he is minimally physically active and enjoys watching television.  Patient Care Team: Hali Marry, MD as PCP - General (Family Medicine) Darius Bump, [facility] as Pharmacist (Pharmacist)  Advanced Directives 09/22/2020 11/03/2018 10/28/2017  Does Patient Have a Medical Advance Directive? Yes Yes Yes  Type of Advance Directive Living will [place];Living will [place];Living will  Does patient want to make changes to medical advance directive? No - Patient declined No - Patient declined Yes (MAU/Ambulatory/Procedural Areas - Information given)  Copy of [place] in Chart? - No - copy requested No - copy requested    Hospital Utilization Over the Past 12 Months: # of hospitalizations or ER visits: 1 # of surgeries: 0  Review of Systems    Patient reports that his overall health is unchanged compared to last year.  History obtained from chart review and the  patient  Patient Reported Readings (BP, Pulse, CBG, Weight, etc) none  Pain Assessment Pain : No/denies pain     Current Medications & Allergies (verified) Allergies as of 09/22/2020       Reactions   Atorvastatin Other (See Comments)   Muscle weakness   Simvastatin Other (See Comments)   Muscle weakness        Medication List        Accurate as of September 22, 2020  9:49 AM. If you have any questions, ask your nurse or doctor.          aspirin 81 MG tablet Take 81 mg by mouth daily.   B-12 PO Take by mouth. Twice a day.   CENTRUM SILVER PO Take 1 capsule by mouth daily.   Evolocumab 140 MG/ML Sosy Inject 140 mg into the skin every 14 (fourteen) days.   famotidine 20 MG tablet Commonly known as: PEPCID Take 20 mg by mouth 2 (two) times daily.   Fish Oil 1000 MG Caps Take 2 capsules by mouth daily.   methimazole 5 MG tablet Commonly known as: TAPAZOLE TAKE ONE-Neal TABLET BY MOUTH ONCE DAILY   metoprolol succinate 50 MG 24 hr tablet Commonly known as: TOPROL-XL Take by mouth.   NON FORMULARY   polyethylene glycol powder 17 GM/SCOOP powder Commonly known as: GLYCOLAX/MIRALAX Take by mouth.   sertraline 50 MG tablet Commonly known as: ZOLOFT TAKE 1 TABLET BY MOUTH  DAILY        History (reviewed): Past Medical History:  Diagnosis Date   Hyperlipidemia    Hypertension    History reviewed. No pertinent surgical history. History reviewed. No pertinent family history.  Social History   Socioeconomic History   Marital status: Married    Spouse name: Russell Neal   Number of children: 1   Years of education: 12   Highest education level: 12th grade  Occupational History   Occupation: retired    Comment: Nurse, learning disability  Tobacco Use   Smoking status: Former    Types: Cigarettes    Quit date: 02/18/1973    Years since quitting: 47.6   Smokeless tobacco: Never  Vaping Use   Vaping Use: Never used  Substance and Sexual Activity   Alcohol  use: Not Currently    Alcohol/week: 1.0 standard drink    Types: 1 Cans of beer per week   Drug use: Never   Sexual activity: Not Currently  Other Topics Concern   Not on file  Social History Narrative   Lives with his wife. He enjoys watching [employer].    Social Determinants of Health   Financial Resource Strain: Low Risk    Difficulty of Paying Living Expenses: Not hard at all  Food Insecurity: No Food Insecurity   Worried About Charity fundraiser in the Last Year: Never true   [place] in the Last Year: Never true  Transportation Needs: No Transportation Needs   Lack of Transportation (Medical): No   Lack of Transportation (Non-Medical): No  Physical Activity: Sufficiently Active   Days of Exercise per Week: 3 days   Minutes of Exercise per Session: 60 min  Stress: No Stress Concern Present   Feeling of Stress : Not at all  Social Connections: Socially Isolated   Frequency of Communication with Friends and Family: Once a week   Frequency of Social Gatherings with Friends and Family: Once a week   Attends Religious Services: Never   Marine scientist or Organizations: No   Attends Archivist Meetings: Never   Marital Status: Married    Activities of Daily Living In your present state of health, do you have any difficulty performing the following activities: 09/22/2020  Hearing? Y  Comment right ear.  Vision? N  Difficulty concentrating or making decisions? N  Walking or climbing stairs? Y  Comment climbing the stairs.  Dressing or bathing? N  Doing errands, shopping? Y  Comment he doesn't drive. his wife takes him to the appointments.  Preparing Food and eating ? N  Using the Toilet? N  In the past six months, have you accidently leaked urine? N  Do you have problems with loss of bowel control? N  Managing your Medications? Y  Comment his wife helps with setting them up.  Managing your Finances? Y  Comment their daughter helps with that.   Housekeeping or managing your Housekeeping? Y  Comment his wife does the housekeeping.  Some recent data might be hidden    Patient Education/ Literacy How often do you need to have someone help you when you read instructions, pamphlets, or other written materials from your doctor or pharmacy?: 1 - Never What is the last grade level you completed in school?: 12th grade  Exercise Current Exercise Habits: Structured exercise class, Type of exercise: stretching;Other - see comments (bike), Time (Minutes): 60, Frequency (Times/Week): 3, Weekly Exercise (Minutes/Week): 180, Intensity: Moderate, Exercise limited by: None identified  Diet Patient reports consuming 3 meals a day and 0 snack(s) a day Patient reports that his primary diet is: Regular Patient reports that she does have regular access to food.   Depression Screen PHQ 2/9 Scores 09/22/2020  11/03/2018 08/17/2018 10/28/2017 07/17/2017 07/17/2017 10/21/2016  PHQ - 2 Score 1 0 6 0 '2 1 2  '$ PHQ- 9 Score 2 - 9 - 2 - 3  Exception Documentation - Medical reason - - - - -     Fall Risk Fall Risk  09/22/2020 05/24/2020 11/03/2018 10/28/2017 07/17/2017  Falls in the past year? 1 0 1 Yes No  Comment - - - fell at gym but no injury  -  Number falls in past yr: 0 - 0 1 -  Injury with Fall? 1 - 0 No -  Risk for fall due to : Impaired balance/gait;History of fall(s) Impaired balance/gait Impaired balance/gait Impaired balance/gait -  Follow up Falls evaluation completed;Education provided;Falls prevention discussed Falls evaluation completed;Falls prevention discussed - Falls prevention discussed -     Objective:  ADREON WYKES seemed alert and oriented and he participated appropriately during our telephone visit.  Blood Pressure Weight BMI  BP Readings from Last 3 Encounters:  07/17/20 120/84  06/11/20 140/89  05/24/20 (!) 147/84   Wt Readings from Last 3 Encounters:  07/17/20 166 lb (75.3 kg)  05/24/20 171 lb (77.6 kg)  02/03/19 168 lb (76.2  kg)   BMI Readings from Last 1 Encounters:  07/17/20 21.90 kg/m    *Unable to obtain current vital signs, weight, and BMI due to telephone visit type  Hearing/Vision  Randal did not seem to have difficulty with hearing/understanding during the telephone conversation Reports that he has had a formal eye exam by an eye care professional within the past year Reports that he has had a formal hearing evaluation within the past year *Unable to fully assess hearing and vision during telephone visit type  Cognitive Function: 6CIT Screen 09/22/2020 11/03/2018 10/28/2017  What Year? 4 points 0 points 0 points  What month? 0 points 0 points 0 points  What time? 0 points 0 points 0 points  Count back from 20 0 points 0 points 0 points  Months in reverse 4 points 0 points 0 points  Repeat phrase 0 points 0 points 2 points  Total Score 8 0 2   (Normal:0-7, Significant for Dysfunction: >8)  Normal Cognitive Function Screening: No: MD notified.   Immunization & Health Maintenance Record Immunization History  Administered Date(s) Administered   Fluad Quad(high Dose 65+) 09/16/2018   Influenza, High Dose Seasonal PF 10/13/2015, 10/21/2016   Influenza, Seasonal, Injecte, Preservative Fre 11/29/2014   Influenza,inj,Quad PF,6+ Mos 10/28/2017   Influenza-Unspecified 11/29/2014   Moderna Sars-Covid-2 Vaccination 10/08/2019, 11/05/2019   Pneumococcal Conjugate-13 01/16/2017   Pneumococcal Polysaccharide-23 01/29/2015   Tdap 01/29/2015, 08/19/2018   Zoster Recombinat (Shingrix) 10/22/2016, 02/12/2017    Health Maintenance  Topic Date Due   COVID-19 Vaccine (3 - Moderna risk series) 10/08/2020 (Originally 12/03/2019)   INFLUENZA VACCINE  04/27/2021 (Originally 08/28/2020)   TETANUS/TDAP  08/18/2028   PNA vac Low Risk Adult  Completed   Zoster Vaccines- Shingrix  Completed   HPV VACCINES  Aged Out       Assessment  This is a routine wellness examination for Russell Neal.  Health Maintenance:  Due or Overdue There are no preventive care reminders to display for this patient.   Russell Neal does not need a referral for Community Assistance: Care Management:   no Social Work:    no Prescription Assistance:  no Nutrition/Diabetes Education:  no   Plan:  Personalized Goals  Goals Addressed  This Visit's Progress     Patient Stated (pt-stated)        09/22/2020 AWV Goal: Fall Prevention  Over the next year, patient will decrease their risk for falls by: Using assistive devices, such as a cane or walker, as needed Identifying fall risks within their home and correcting them by: Removing throw rugs Adding handrails to stairs or ramps Removing clutter and keeping a clear pathway throughout the home Increasing light, especially at night Adding shower handles/bars Raising toilet seat Identifying potential personal risk factors for falls: Medication side effects Incontinence/urgency Vestibular dysfunction Hearing loss Musculoskeletal disorders Neurological disorders Orthostatic hypotension         Personalized Health Maintenance & Screening Recommendations  Influenza vaccine  Lung Cancer Screening Recommended: no (Low Dose CT Chest recommended if Age 26-80 years, 30 pack-year currently smoking OR have quit w/in past 15 years) Hepatitis C Screening recommended: no HIV Screening recommended: no  Advanced Directives: Written information was not prepared per patient's request.  Referrals & Orders No orders of the defined types were placed in this encounter.   Follow-up Plan Follow-up with Hali Marry, MD as planned Schedule your influenza vaccine.  Medicare wellness visit in one year. AVS printed and mailed to the patient.   I have personally reviewed and noted the following in the patient's chart:   Medical and social history Use of alcohol, tobacco or illicit drugs  Current medications and supplements Functional ability and  status Nutritional status Physical activity Advanced directives List of other physicians Hospitalizations, surgeries, and ER visits in previous 12 months Vitals Screenings to include cognitive, depression, and falls Referrals and appointments  In addition, I have reviewed and discussed with Russell Neal certain preventive protocols, quality metrics, and best practice recommendations. A written personalized care plan for preventive services as well as general preventive health recommendations is available and can be mailed to the patient at his request.      Tinnie Gens, RN  09/22/2020

## 2020-09-22 NOTE — Patient Instructions (Addendum)
[facility] Maintenance Summary and Written Plan of Care  Russell Neal ,  Thank you for allowing me to perform your Medicare Annual Wellness Visit and for your ongoing commitment to your health.   Health Maintenance & Immunization History Health Maintenance  Topic Date Due   COVID-19 Vaccine (3 - Moderna risk series) 10/08/2020 (Originally 12/03/2019)   INFLUENZA VACCINE  04/27/2021 (Originally 08/28/2020)   TETANUS/TDAP  08/18/2028   PNA vac Low Risk Adult  Completed   Zoster Vaccines- Shingrix  Completed   HPV VACCINES  Aged Out   Immunization History  Administered Date(s) Administered   Fluad Quad(high Dose 65+) 09/16/2018   Influenza, High Dose Seasonal PF 10/13/2015, 10/21/2016   Influenza, Seasonal, Injecte, Preservative Fre 11/29/2014   Influenza,inj,Quad PF,6+ Mos 10/28/2017   Influenza-Unspecified 11/29/2014   Moderna Sars-Covid-2 Vaccination 10/08/2019, 11/05/2019   Pneumococcal Conjugate-13 01/16/2017   Pneumococcal Polysaccharide-23 01/29/2015   Tdap 01/29/2015, 08/19/2018   Zoster Recombinat (Shingrix) 10/22/2016, 02/12/2017    These are the patient goals that we discussed:  Goals Addressed               This Visit's Progress     Patient Stated (pt-stated)        09/22/2020 AWV Goal: Fall Prevention  Over the next year, patient will decrease their risk for falls by: Using assistive devices, such as a cane or walker, as needed Identifying fall risks within their home and correcting them by: Removing throw rugs Adding handrails to stairs or ramps Removing clutter and keeping a clear pathway throughout the home Increasing light, especially at night Adding shower handles/bars Raising toilet seat Identifying potential personal risk factors for falls: Medication side effects Incontinence/urgency Vestibular dysfunction Hearing loss Musculoskeletal disorders Neurological disorders Orthostatic hypotension           This is a  list of Health Maintenance Items that are overdue or due now: Influenza vaccine  Orders/Referrals Placed Today: No orders of the defined types were placed in this encounter.  (Contact our referral department at [phone] if you have not spoken with someone about your referral appointment within the next 5 days)    Follow-up Plan Follow-up with Hali Marry, MD as planned Schedule your influenza vaccine.  Medicare wellness visit in one year. AVS printed and mailed to the patient.     Health Maintenance, Male Adopting a healthy lifestyle and getting preventive care are important in promoting health and wellness. Ask your health care provider about: The right schedule for you to have regular tests and exams. Things you can do on your own to prevent diseases and keep yourself healthy. What should I know about diet, weight, and exercise? Eat a healthy diet  Eat a diet that includes plenty of vegetables, fruits, low-fat dairy products, and lean protein. Do not eat a lot of foods that are high in solid fats, added sugars, or sodium.  Maintain a healthy weight Body mass index (BMI) is a measurement that can be used to identify possible weight problems. It estimates body fat based on height and weight. Your health care provider can help determine your BMI and help you achieve or maintain ahealthy weight. Get regular exercise Get regular exercise. This is one of the most important things you can do for your health. Most adults should: Exercise for at least 150 minutes each week. The exercise should increase your heart rate and make you sweat (moderate-intensity exercise). Do strengthening exercises at least twice a week. This is  in addition to the moderate-intensity exercise. Spend less time sitting. Even light physical activity can be beneficial. Watch cholesterol and blood lipids Have your blood tested for lipids and cholesterol at 80 years of age, then havethis test every 5  years. You may need to have your cholesterol levels checked more often if: Your lipid or cholesterol levels are high. You are older than 80 years of age. You are at high risk for heart disease. What should I know about cancer screening? Many types of cancers can be detected early and may often be prevented. Depending on your health history and family history, you may need to have cancer screening at various ages. This may include screening for: Colorectal cancer. Prostate cancer. Skin cancer. Lung cancer. What should I know about heart disease, diabetes, and high blood pressure? Blood pressure and heart disease High blood pressure causes heart disease and increases the risk of stroke. This is more likely to develop in people who have high blood pressure readings, are of African descent, or are overweight. Talk with your health care provider about your target blood pressure readings. Have your blood pressure checked: Every 3-5 years if you are 13-75 years of age. Every year if you are 45 years old or older. If you are between the ages of 16 and 43 and are a current or former smoker, ask your health care provider if you should have a one-time screening for abdominal aortic aneurysm (AAA). Diabetes Have regular diabetes screenings. This checks your fasting blood sugar level. Have the screening done: Once every three years after age 75 if you are at a normal weight and have a low risk for diabetes. More often and at a younger age if you are overweight or have a high risk for diabetes. What should I know about preventing infection? Hepatitis B If you have a higher risk for hepatitis B, you should be screened for this virus. Talk with your health care provider to find out if you are at risk forhepatitis B infection. Hepatitis C Blood testing is recommended for: Everyone born from [birth year] through 1965. Anyone with known risk factors for hepatitis C. Sexually transmitted infections (STIs) You  should be screened each year for STIs, including gonorrhea and chlamydia, if: You are sexually active and are younger than 80 years of age. You are older than 80 years of age and your health care provider tells you that you are at risk for this type of infection. Your sexual activity has changed since you were last screened, and you are at increased risk for chlamydia or gonorrhea. Ask your health care provider if you are at risk. Ask your health care provider about whether you are at high risk for HIV. Your health care provider may recommend a prescription medicine to help prevent HIV infection. If you choose to take medicine to prevent HIV, you should first get tested for HIV. You should then be tested every 3 months for as long as you are taking the medicine. Follow these instructions at home: Lifestyle Do not use any products that contain nicotine or tobacco, such as cigarettes, e-cigarettes, and chewing tobacco. If you need help quitting, ask your health care provider. Do not use street drugs. Do not share needles. Ask your health care provider for help if you need support or information about quitting drugs. Alcohol use Do not drink alcohol if your health care provider tells you not to drink. If you drink alcohol: Limit how much you have to 0-2 drinks a  day. Be aware of how much alcohol is in your drink. In the [place] one drink equals one 12 oz bottle of beer (355 mL), one 5 oz glass of wine (148 mL), or one 1 oz glass of hard liquor (44 mL). General instructions Schedule regular health, dental, and eye exams. Stay current with your vaccines. Tell your health care provider if: You often feel depressed. You have ever been abused or do not feel safe at home. Summary Adopting a healthy lifestyle and getting preventive care are important in promoting health and wellness. Follow your health care provider's instructions about healthy diet, exercising, and getting tested or screened for  diseases. Follow your health care provider's instructions on monitoring your cholesterol and blood pressure. This information is not intended to replace advice given to you by your health care provider. Make sure you discuss any questions you have with your healthcare provider. Document Revised: 01/07/2018 Document Reviewed: 01/07/2018 Elsevier Patient Education  2022 [employer].

## 2020-09-27 DIAGNOSIS — Z955 Presence of coronary angioplasty implant and graft: Secondary | ICD-10-CM | POA: Diagnosis not present

## 2020-09-27 DIAGNOSIS — I10 Essential (primary) hypertension: Secondary | ICD-10-CM | POA: Diagnosis not present

## 2020-09-27 DIAGNOSIS — E7800 Pure hypercholesterolemia, unspecified: Secondary | ICD-10-CM | POA: Diagnosis not present

## 2020-09-27 DIAGNOSIS — I2510 Atherosclerotic heart disease of native coronary artery without angina pectoris: Secondary | ICD-10-CM | POA: Diagnosis not present

## 2020-10-01 DIAGNOSIS — R1013 Epigastric pain: Secondary | ICD-10-CM | POA: Diagnosis not present

## 2020-10-01 DIAGNOSIS — G4489 Other headache syndrome: Secondary | ICD-10-CM | POA: Diagnosis not present

## 2020-10-01 DIAGNOSIS — S0003XA Contusion of scalp, initial encounter: Secondary | ICD-10-CM | POA: Diagnosis not present

## 2020-10-01 DIAGNOSIS — R296 Repeated falls: Secondary | ICD-10-CM | POA: Diagnosis not present

## 2020-10-01 DIAGNOSIS — S022XXA Fracture of nasal bones, initial encounter for closed fracture: Secondary | ICD-10-CM | POA: Diagnosis not present

## 2020-10-01 DIAGNOSIS — I4519 Other right bundle-branch block: Secondary | ICD-10-CM | POA: Diagnosis not present

## 2020-10-01 DIAGNOSIS — Z87891 Personal history of nicotine dependence: Secondary | ICD-10-CM | POA: Diagnosis not present

## 2020-10-01 DIAGNOSIS — S022XXB Fracture of nasal bones, initial encounter for open fracture: Secondary | ICD-10-CM | POA: Diagnosis not present

## 2020-10-01 DIAGNOSIS — Z888 Allergy status to other drugs, medicaments and biological substances status: Secondary | ICD-10-CM | POA: Diagnosis not present

## 2020-10-01 DIAGNOSIS — S0101XA Laceration without foreign body of scalp, initial encounter: Secondary | ICD-10-CM | POA: Diagnosis not present

## 2020-10-01 DIAGNOSIS — I10 Essential (primary) hypertension: Secondary | ICD-10-CM | POA: Diagnosis not present

## 2020-10-01 DIAGNOSIS — Z7982 Long term (current) use of aspirin: Secondary | ICD-10-CM | POA: Diagnosis not present

## 2020-10-01 DIAGNOSIS — R410 Disorientation, unspecified: Secondary | ICD-10-CM | POA: Diagnosis not present

## 2020-10-01 DIAGNOSIS — Z79899 Other long term (current) drug therapy: Secondary | ICD-10-CM | POA: Diagnosis not present

## 2020-10-01 DIAGNOSIS — I440 Atrioventricular block, first degree: Secondary | ICD-10-CM | POA: Diagnosis not present

## 2020-10-01 DIAGNOSIS — I4430 Unspecified atrioventricular block: Secondary | ICD-10-CM | POA: Diagnosis not present

## 2020-10-01 DIAGNOSIS — R58 Hemorrhage, not elsewhere classified: Secondary | ICD-10-CM | POA: Diagnosis not present

## 2020-10-01 DIAGNOSIS — G8911 Acute pain due to trauma: Secondary | ICD-10-CM | POA: Diagnosis not present

## 2020-10-01 DIAGNOSIS — S199XXA Unspecified injury of neck, initial encounter: Secondary | ICD-10-CM | POA: Diagnosis not present

## 2020-10-04 ENCOUNTER — Ambulatory Visit (INDEPENDENT_AMBULATORY_CARE_PROVIDER_SITE_OTHER): Payer: Medicare Other | Admitting: Family Medicine

## 2020-10-04 DIAGNOSIS — Z23 Encounter for immunization: Secondary | ICD-10-CM

## 2020-10-06 ENCOUNTER — Encounter: Payer: Self-pay | Admitting: Family Medicine

## 2020-10-06 DIAGNOSIS — S0101XS Laceration without foreign body of scalp, sequela: Secondary | ICD-10-CM | POA: Diagnosis not present

## 2020-10-06 DIAGNOSIS — S0121XS Laceration without foreign body of nose, sequela: Secondary | ICD-10-CM | POA: Diagnosis not present

## 2020-10-06 DIAGNOSIS — R296 Repeated falls: Secondary | ICD-10-CM | POA: Insufficient documentation

## 2020-10-06 DIAGNOSIS — M6281 Muscle weakness (generalized): Secondary | ICD-10-CM

## 2020-10-08 ENCOUNTER — Other Ambulatory Visit: Payer: Self-pay | Admitting: Family Medicine

## 2020-10-09 ENCOUNTER — Telehealth: Payer: Self-pay | Admitting: Family Medicine

## 2020-10-09 DIAGNOSIS — R296 Repeated falls: Secondary | ICD-10-CM

## 2020-10-09 DIAGNOSIS — M6281 Muscle weakness (generalized): Secondary | ICD-10-CM

## 2020-10-09 NOTE — Telephone Encounter (Signed)
Orders Placed This Encounter  Procedures   Ambulatory referral to Home Health    Referral Priority:   Routine    Referral Type:   Home Health Care    Referral Reason:   Specialty Services Required    Requested Specialty:   Home Health Services    Number of Visits Requested:   1    

## 2020-10-11 ENCOUNTER — Telehealth: Payer: Self-pay | Admitting: *Deleted

## 2020-10-11 NOTE — Telephone Encounter (Signed)
Wells Guiles w/[facility] lvm stating that Mr. Buntin decline services. She asked if she could get a Verbal Order for a Delay of Admission until tomorrow for him.

## 2020-10-11 NOTE — Telephone Encounter (Signed)
Tried calling Russell Neal back to give VO. The phone would ring several times no answer/VM.  I made 4 attempts to call w/no sucess

## 2020-12-16 DIAGNOSIS — R918 Other nonspecific abnormal finding of lung field: Secondary | ICD-10-CM | POA: Diagnosis not present

## 2020-12-16 DIAGNOSIS — I10 Essential (primary) hypertension: Secondary | ICD-10-CM | POA: Diagnosis not present

## 2020-12-16 DIAGNOSIS — I2510 Atherosclerotic heart disease of native coronary artery without angina pectoris: Secondary | ICD-10-CM | POA: Diagnosis not present

## 2020-12-16 DIAGNOSIS — R531 Weakness: Secondary | ICD-10-CM | POA: Diagnosis not present

## 2020-12-16 DIAGNOSIS — Z7409 Other reduced mobility: Secondary | ICD-10-CM | POA: Diagnosis not present

## 2020-12-16 DIAGNOSIS — D72819 Decreased white blood cell count, unspecified: Secondary | ICD-10-CM | POA: Diagnosis not present

## 2020-12-16 DIAGNOSIS — E0590 Thyrotoxicosis, unspecified without thyrotoxic crisis or storm: Secondary | ICD-10-CM | POA: Diagnosis not present

## 2020-12-16 DIAGNOSIS — Z87891 Personal history of nicotine dependence: Secondary | ICD-10-CM | POA: Diagnosis not present

## 2020-12-16 DIAGNOSIS — E7800 Pure hypercholesterolemia, unspecified: Secondary | ICD-10-CM | POA: Diagnosis not present

## 2020-12-16 DIAGNOSIS — Z20822 Contact with and (suspected) exposure to covid-19: Secondary | ICD-10-CM | POA: Diagnosis not present

## 2020-12-16 DIAGNOSIS — Z951 Presence of aortocoronary bypass graft: Secondary | ICD-10-CM | POA: Diagnosis not present

## 2020-12-16 DIAGNOSIS — G9341 Metabolic encephalopathy: Secondary | ICD-10-CM | POA: Diagnosis not present

## 2020-12-16 DIAGNOSIS — Z7982 Long term (current) use of aspirin: Secondary | ICD-10-CM | POA: Diagnosis not present

## 2020-12-16 DIAGNOSIS — I517 Cardiomegaly: Secondary | ICD-10-CM | POA: Diagnosis not present

## 2020-12-16 DIAGNOSIS — G9389 Other specified disorders of brain: Secondary | ICD-10-CM | POA: Diagnosis not present

## 2020-12-16 DIAGNOSIS — I6523 Occlusion and stenosis of bilateral carotid arteries: Secondary | ICD-10-CM | POA: Diagnosis not present

## 2020-12-16 DIAGNOSIS — Z87820 Personal history of traumatic brain injury: Secondary | ICD-10-CM | POA: Diagnosis not present

## 2020-12-16 DIAGNOSIS — F419 Anxiety disorder, unspecified: Secondary | ICD-10-CM | POA: Diagnosis not present

## 2020-12-16 DIAGNOSIS — Z955 Presence of coronary angioplasty implant and graft: Secondary | ICD-10-CM | POA: Diagnosis not present

## 2020-12-16 DIAGNOSIS — I6782 Cerebral ischemia: Secondary | ICD-10-CM | POA: Diagnosis not present

## 2020-12-16 DIAGNOSIS — M6281 Muscle weakness (generalized): Secondary | ICD-10-CM | POA: Diagnosis not present

## 2020-12-16 DIAGNOSIS — Z888 Allergy status to other drugs, medicaments and biological substances status: Secondary | ICD-10-CM | POA: Diagnosis not present

## 2020-12-16 DIAGNOSIS — R29818 Other symptoms and signs involving the nervous system: Secondary | ICD-10-CM | POA: Diagnosis not present

## 2020-12-16 DIAGNOSIS — G629 Polyneuropathy, unspecified: Secondary | ICD-10-CM | POA: Diagnosis not present

## 2020-12-16 DIAGNOSIS — G459 Transient cerebral ischemic attack, unspecified: Secondary | ICD-10-CM | POA: Diagnosis not present

## 2020-12-16 DIAGNOSIS — G9340 Encephalopathy, unspecified: Secondary | ICD-10-CM | POA: Diagnosis not present

## 2020-12-16 DIAGNOSIS — Z79899 Other long term (current) drug therapy: Secondary | ICD-10-CM | POA: Diagnosis not present

## 2020-12-16 DIAGNOSIS — I4510 Unspecified right bundle-branch block: Secondary | ICD-10-CM | POA: Diagnosis not present

## 2020-12-17 DIAGNOSIS — I083 Combined rheumatic disorders of mitral, aortic and tricuspid valves: Secondary | ICD-10-CM | POA: Diagnosis not present

## 2020-12-17 DIAGNOSIS — D72819 Decreased white blood cell count, unspecified: Secondary | ICD-10-CM | POA: Diagnosis not present

## 2020-12-17 DIAGNOSIS — G459 Transient cerebral ischemic attack, unspecified: Secondary | ICD-10-CM | POA: Diagnosis not present

## 2020-12-17 DIAGNOSIS — I517 Cardiomegaly: Secondary | ICD-10-CM | POA: Diagnosis not present

## 2020-12-17 DIAGNOSIS — I2510 Atherosclerotic heart disease of native coronary artery without angina pectoris: Secondary | ICD-10-CM | POA: Diagnosis not present

## 2020-12-17 DIAGNOSIS — G629 Polyneuropathy, unspecified: Secondary | ICD-10-CM | POA: Diagnosis not present

## 2020-12-17 DIAGNOSIS — E0590 Thyrotoxicosis, unspecified without thyrotoxic crisis or storm: Secondary | ICD-10-CM | POA: Diagnosis not present

## 2020-12-17 DIAGNOSIS — I10 Essential (primary) hypertension: Secondary | ICD-10-CM | POA: Diagnosis not present

## 2020-12-17 DIAGNOSIS — E7800 Pure hypercholesterolemia, unspecified: Secondary | ICD-10-CM | POA: Diagnosis not present

## 2020-12-18 ENCOUNTER — Other Ambulatory Visit: Payer: Self-pay

## 2020-12-18 ENCOUNTER — Ambulatory Visit (INDEPENDENT_AMBULATORY_CARE_PROVIDER_SITE_OTHER): Payer: Medicare Other | Admitting: Family Medicine

## 2020-12-18 ENCOUNTER — Encounter: Payer: Self-pay | Admitting: Family Medicine

## 2020-12-18 ENCOUNTER — Ambulatory Visit (INDEPENDENT_AMBULATORY_CARE_PROVIDER_SITE_OTHER): Payer: Medicare Other

## 2020-12-18 VITALS — BP 114/81 | HR 59 | Ht 73.0 in | Wt 164.0 lb

## 2020-12-18 DIAGNOSIS — M8588 Other specified disorders of bone density and structure, other site: Secondary | ICD-10-CM | POA: Diagnosis not present

## 2020-12-18 DIAGNOSIS — R29818 Other symptoms and signs involving the nervous system: Secondary | ICD-10-CM

## 2020-12-18 DIAGNOSIS — I2510 Atherosclerotic heart disease of native coronary artery without angina pectoris: Secondary | ICD-10-CM

## 2020-12-18 DIAGNOSIS — I959 Hypotension, unspecified: Secondary | ICD-10-CM | POA: Diagnosis not present

## 2020-12-18 DIAGNOSIS — R221 Localized swelling, mass and lump, neck: Secondary | ICD-10-CM | POA: Diagnosis not present

## 2020-12-18 DIAGNOSIS — R29898 Other symptoms and signs involving the musculoskeletal system: Secondary | ICD-10-CM

## 2020-12-18 DIAGNOSIS — R531 Weakness: Secondary | ICD-10-CM | POA: Diagnosis not present

## 2020-12-18 DIAGNOSIS — R413 Other amnesia: Secondary | ICD-10-CM

## 2020-12-18 DIAGNOSIS — M47812 Spondylosis without myelopathy or radiculopathy, cervical region: Secondary | ICD-10-CM | POA: Diagnosis not present

## 2020-12-18 NOTE — Progress Notes (Signed)
Established Patient Office Visit  Subjective:  Patient ID: DEANGLEO PASSAGE, male    DOB: 30-Sep-1940  Age: 80 y.o. MRN: [id number]  CC:  Chief Complaint  Patient presents with   Hospitalization Follow-up    HPI Russell Neal presents for Hospital Follow up - He was admitted on 12/16/20 and d/c home the next day.  Had word finding difficulty, confusion and was dragging his right foot.  He had a CT of the head without any acute findings.  MRI of the brain without evidence of stroke and echocardiogram showing normal EF with no intra-arterial shunt.  They felt like his symptoms were most consistent with a TIA versus metabolic encephalopathy.  They felt that his speech and physical function had returned back to normal by the time he left so they did not place any referrals for OT or speech therapy.  He was not placed on a statin due to declining that specific treatment.  He was discharged home on Plavix and aspirin.  His wife is here today.  She is concerned about whether or not the TIA diagnosis was correct she is very concerned about him being on 2 antiplatelet drugs.  They do have a neurology follow-up scheduled for Tuesday.  He reports he had been going to the gym about 3 days/week and even doing some leg exercises but he is having weakness that he had when he first presented and then again this morning his wife noticed that he had difficulty getting up from the toilet and back she had a call the fire department to help him up.  She feels like the more he is moved around today the better it is gotten.  But she feels like he still weak in his lower extremities he denies any recent injury or trauma he does have some chronic neck pain nothing new.  But he has fallen a lot especially in the last year.  He also reports that his memory has gotten worse of the last year he will start to say something and then will forget what he is talking about.  Past Medical History:  Diagnosis Date   Hyperlipidemia     Hypertension     No past surgical history on file.  No family history on file.  MRI brain without contrast:   INDICATION:  TIA   TECHNIQUE: Axial T2 and FLAIR. Axial and coronal DWI.   COMPARISON: CT scan of the brain dated 12/16/2020 MRI of the brain dated 02/29/2016   FINDINGS:  #  Examination is limited by patient motion.   #  The ventricles and subarachnoid spaces are prominent consistent with involutional change.   #  There are scattered areas of increased T2 and FLAIR signal throughout the periventricular and subcortical white matter consistent with small vessel ischemic change.   There is no abnormal signal on the DWI sequence to suggest acute infarction.   There is been a prior right occipital craniectomy with diffuse encephalomalacia throughout the right cerebellum   There is no evidence to suggest intracranial hemorrhage.   There is no edema mass effect or midline shift.   The central flow voids are grossly patent.   Limited evaluation of the orbits and paranasal sinuses demonstrates no gross abnormality.   Outpatient Medications Prior to Visit  Medication Sig Dispense Refill   clopidogrel (PLAVIX) 75 MG tablet Take 75 mg by mouth daily.     REPATHA SURECLICK 300 MG/ML SOAJ Inject 1 Syringe into the skin every 14 (fourteen)  days.     aspirin 81 MG tablet Take 81 mg by mouth daily.     Cyanocobalamin (B-12 PO) Take by mouth. Twice a day.     methimazole (TAPAZOLE) 5 MG tablet TAKE ONE-HALF TABLET BY MOUTH ONCE DAILY     metoprolol succinate (TOPROL-XL) 50 MG 24 hr tablet Take by mouth.     Omega-3 Fatty Acids (FISH OIL) 1000 MG CAPS Take 2 capsules by mouth daily.     polyethylene glycol powder (GLYCOLAX/MIRALAX) 17 GM/SCOOP powder Take by mouth.     sertraline (ZOLOFT) 50 MG tablet TAKE 1 TABLET BY MOUTH  DAILY 90 tablet 3   Evolocumab 140 MG/ML SOSY Inject 140 mg into the skin every 14 (fourteen) days.     famotidine (PEPCID) 20 MG tablet Take 20 mg by mouth 2  (two) times daily. (Patient not taking: Reported on 09/22/2020)     Multiple Vitamins-Minerals (CENTRUM SILVER PO) Take 1 capsule by mouth daily. (Patient not taking: Reported on 09/22/2020)     NON FORMULARY  (Patient not taking: Reported on 09/22/2020)     No facility-administered medications prior to visit.    Allergies  Allergen Reactions   Atorvastatin Other (See Comments)    Muscle weakness   Simvastatin Other (See Comments)    Muscle weakness    ROS Review of Systems    Objective:    Physical Exam Constitutional:      Appearance: Normal appearance. He is well-developed.  HENT:     Head: Normocephalic and atraumatic.  Cardiovascular:     Rate and Rhythm: Normal rate and regular rhythm.     Heart sounds: Normal heart sounds.  Pulmonary:     Effort: Pulmonary effort is normal.     Breath sounds: Normal breath sounds.  Musculoskeletal:     Comments: Hip flexion is 4-5 bilaterally.  Knee flexion is 4-5 bilaterally.  Knee extension is 0 out of 5 bilaterally.  Left foot dorsiflexion is 0 out of 5.  In fact he tries to turn his foot inward when he tries to dorsiflex.  Dorsiflexion of the right foot is 3 out of 5.  And plantar flexion is 4-5 bilaterally.  Skin:    General: Skin is warm and dry.  Neurological:     Mental Status: He is alert and oriented to person, place, and time. Mental status is at baseline.  Psychiatric:        Behavior: Behavior normal.    BP 114/81   Pulse (!) 59   Ht 6\' 1"  (1.854 m)   Wt 164 lb (74.4 kg)   SpO2 100%   BMI 21.64 kg/m  Wt Readings from Last 3 Encounters:  12/18/20 164 lb (74.4 kg)  07/17/20 166 lb (75.3 kg)  05/24/20 171 lb (77.6 kg)     Health Maintenance Due  Topic Date Due   COVID-19 Vaccine (3 - Moderna risk series) 12/03/2019    There are no preventive care reminders to display for this patient.  No results found for: TSH Lab Results  Component Value Date   WBC 6.9 06/28/2016   HGB 14.8 06/28/2016   HCT 42.3  06/28/2016   MCV 94.6 06/28/2016   PLT 180 06/28/2016   Lab Results  Component Value Date   NA 137 06/28/2016   K 5.0 06/28/2016   CO2 28 06/28/2016   GLUCOSE 89 06/28/2016   BUN 18 06/28/2016   CREATININE 0.50 (L) 06/28/2016   BILITOT 0.6 06/28/2016   ALKPHOS 70 06/28/2016  AST 27 06/28/2016   ALT 31 06/28/2016   PROT 6.6 06/28/2016   ALBUMIN 4.2 06/28/2016   CALCIUM 9.6 06/28/2016   Lab Results  Component Value Date   CHOL 108 10/28/2017   Lab Results  Component Value Date   HDL 32 (L) 10/28/2017   Lab Results  Component Value Date   LDLCALC 51 10/28/2017   Lab Results  Component Value Date   TRIG 170 (H) 10/28/2017   Lab Results  Component Value Date   CHOLHDL 3.4 10/28/2017   Lab Results  Component Value Date   HGBA1C 5.3 10/28/2017      Assessment & Plan:   Problem List Items Addressed This Visit       Other   Memory change    History of repetitive head trauma so I would suspect some slowing of mentation over time.  Consider alternatives such as onset of Alzheimer's etc.  Recommend address further with neurology.      Other Visit Diagnoses     Weakness of both lower extremities    -  Primary   Relevant Orders   DG Cervical Spine Complete   DG Lumbar Spine Complete   Transient neurological symptoms       Hypotension, unspecified hypotension type       Relevant Medications   REPATHA SURECLICK 150 MG/ML SOAJ       Both lower extremities he has significant weakness with extension of the knees.  He is unable to extend either knee bilaterally.  He also has significant weakness with dorsiflexion of the left foot in fact he is unable to do it at all.  Had a really hard time pinpointing exactly when this happened but it sounds like he was doing leg exercises at the gym up until last Wednesday so this is very concerning to me he did have a essentially negative head CT and brain MRI.  So would like to get plain cervical films and lumbar spine films  today but he may need further work-up of his spinal cord.  Again he has neurology appointment on Tuesday so they can help better assess this as well.  I did not check deep tendon reflexes today  Hypotension-blood pressure was extremely low in the emergency department.  I did ask him and his wife to check his pressure couple times this week and next week to let me know if it is running low at home it is definitely a little on the lower end today but not as low as it was in the ED.  We may need to decrease his metoprolol and see if that makes a difference in some of his symptoms.  He feels like he eats and hydrates normally he does not feel like there is been a significant change.  Though he reports he really does not drink a lot of water.  Transient neurologic symptoms-did discuss that the leading diagnosis was TIA but this is not 100% confirmed.  Can certainly discuss with neurology next Tuesday about whether or not to continue both anticoagulants.  No orders of the defined types were placed in this encounter.   Follow-up: No follow-ups on file.    Beatrice Lecher, MD

## 2020-12-18 NOTE — Assessment & Plan Note (Signed)
History of repetitive head trauma so I would suspect some slowing of mentation over time.  Consider alternatives such as onset of Alzheimer's etc.  Recommend address further with neurology.

## 2020-12-19 NOTE — Progress Notes (Signed)
Hi Russell Neal, there is a second result for your lumbar spine film.  This 1 is for your neck.  It was very difficult to interpret the film because your bones are so thin that they really look like a shadow on the film.  But you did have a lot of arthritis but they did not see any sign of fracture etc.

## 2020-12-19 NOTE — Progress Notes (Signed)
Hi Russell Neal, you have a lot of arthritis in your low back as well as some disc space narrowing indicated herniating disc and and some bone spurs.  It looks like you might also have an old compression fracture at L2.  You also have a lot of gas and distention in your colon not sure if you have been having problems with your bowels I know we did not really talk about that yesterday.  I would like to consider getting an MRI of the lumbar spine.  If you are okay with that then please let me know.

## 2020-12-23 DIAGNOSIS — R531 Weakness: Secondary | ICD-10-CM | POA: Diagnosis not present

## 2020-12-23 DIAGNOSIS — E7800 Pure hypercholesterolemia, unspecified: Secondary | ICD-10-CM | POA: Diagnosis not present

## 2020-12-23 DIAGNOSIS — F419 Anxiety disorder, unspecified: Secondary | ICD-10-CM | POA: Diagnosis not present

## 2020-12-23 DIAGNOSIS — I2510 Atherosclerotic heart disease of native coronary artery without angina pectoris: Secondary | ICD-10-CM | POA: Diagnosis not present

## 2020-12-23 DIAGNOSIS — E039 Hypothyroidism, unspecified: Secondary | ICD-10-CM | POA: Diagnosis not present

## 2020-12-23 DIAGNOSIS — Z7982 Long term (current) use of aspirin: Secondary | ICD-10-CM | POA: Diagnosis not present

## 2020-12-23 DIAGNOSIS — D72819 Decreased white blood cell count, unspecified: Secondary | ICD-10-CM | POA: Diagnosis not present

## 2020-12-23 DIAGNOSIS — G459 Transient cerebral ischemic attack, unspecified: Secondary | ICD-10-CM | POA: Diagnosis not present

## 2020-12-23 DIAGNOSIS — Z7902 Long term (current) use of antithrombotics/antiplatelets: Secondary | ICD-10-CM | POA: Diagnosis not present

## 2020-12-23 DIAGNOSIS — G629 Polyneuropathy, unspecified: Secondary | ICD-10-CM | POA: Diagnosis not present

## 2020-12-23 DIAGNOSIS — I10 Essential (primary) hypertension: Secondary | ICD-10-CM | POA: Diagnosis not present

## 2020-12-26 DIAGNOSIS — G629 Polyneuropathy, unspecified: Secondary | ICD-10-CM | POA: Diagnosis not present

## 2020-12-26 DIAGNOSIS — I2510 Atherosclerotic heart disease of native coronary artery without angina pectoris: Secondary | ICD-10-CM | POA: Diagnosis not present

## 2020-12-26 DIAGNOSIS — D72819 Decreased white blood cell count, unspecified: Secondary | ICD-10-CM | POA: Diagnosis not present

## 2020-12-26 DIAGNOSIS — I10 Essential (primary) hypertension: Secondary | ICD-10-CM | POA: Diagnosis not present

## 2020-12-26 DIAGNOSIS — F419 Anxiety disorder, unspecified: Secondary | ICD-10-CM | POA: Diagnosis not present

## 2020-12-26 DIAGNOSIS — G459 Transient cerebral ischemic attack, unspecified: Secondary | ICD-10-CM | POA: Diagnosis not present

## 2020-12-28 DIAGNOSIS — I10 Essential (primary) hypertension: Secondary | ICD-10-CM | POA: Diagnosis not present

## 2020-12-28 DIAGNOSIS — G629 Polyneuropathy, unspecified: Secondary | ICD-10-CM | POA: Diagnosis not present

## 2020-12-28 DIAGNOSIS — I2510 Atherosclerotic heart disease of native coronary artery without angina pectoris: Secondary | ICD-10-CM | POA: Diagnosis not present

## 2020-12-28 DIAGNOSIS — D72819 Decreased white blood cell count, unspecified: Secondary | ICD-10-CM | POA: Diagnosis not present

## 2020-12-28 DIAGNOSIS — F419 Anxiety disorder, unspecified: Secondary | ICD-10-CM | POA: Diagnosis not present

## 2020-12-28 DIAGNOSIS — G459 Transient cerebral ischemic attack, unspecified: Secondary | ICD-10-CM | POA: Diagnosis not present

## 2021-01-02 DIAGNOSIS — D72819 Decreased white blood cell count, unspecified: Secondary | ICD-10-CM | POA: Diagnosis not present

## 2021-01-02 DIAGNOSIS — G459 Transient cerebral ischemic attack, unspecified: Secondary | ICD-10-CM | POA: Diagnosis not present

## 2021-01-02 DIAGNOSIS — I2510 Atherosclerotic heart disease of native coronary artery without angina pectoris: Secondary | ICD-10-CM | POA: Diagnosis not present

## 2021-01-02 DIAGNOSIS — G629 Polyneuropathy, unspecified: Secondary | ICD-10-CM | POA: Diagnosis not present

## 2021-01-02 DIAGNOSIS — I10 Essential (primary) hypertension: Secondary | ICD-10-CM | POA: Diagnosis not present

## 2021-01-02 DIAGNOSIS — F419 Anxiety disorder, unspecified: Secondary | ICD-10-CM | POA: Diagnosis not present

## 2021-01-04 DIAGNOSIS — D72819 Decreased white blood cell count, unspecified: Secondary | ICD-10-CM | POA: Diagnosis not present

## 2021-01-04 DIAGNOSIS — I10 Essential (primary) hypertension: Secondary | ICD-10-CM | POA: Diagnosis not present

## 2021-01-04 DIAGNOSIS — F419 Anxiety disorder, unspecified: Secondary | ICD-10-CM | POA: Diagnosis not present

## 2021-01-04 DIAGNOSIS — G629 Polyneuropathy, unspecified: Secondary | ICD-10-CM | POA: Diagnosis not present

## 2021-01-04 DIAGNOSIS — G459 Transient cerebral ischemic attack, unspecified: Secondary | ICD-10-CM | POA: Diagnosis not present

## 2021-01-04 DIAGNOSIS — I2510 Atherosclerotic heart disease of native coronary artery without angina pectoris: Secondary | ICD-10-CM | POA: Diagnosis not present

## 2021-01-09 DIAGNOSIS — D72819 Decreased white blood cell count, unspecified: Secondary | ICD-10-CM | POA: Diagnosis not present

## 2021-01-09 DIAGNOSIS — I2510 Atherosclerotic heart disease of native coronary artery without angina pectoris: Secondary | ICD-10-CM | POA: Diagnosis not present

## 2021-01-09 DIAGNOSIS — F419 Anxiety disorder, unspecified: Secondary | ICD-10-CM | POA: Diagnosis not present

## 2021-01-09 DIAGNOSIS — G629 Polyneuropathy, unspecified: Secondary | ICD-10-CM | POA: Diagnosis not present

## 2021-01-09 DIAGNOSIS — G459 Transient cerebral ischemic attack, unspecified: Secondary | ICD-10-CM | POA: Diagnosis not present

## 2021-01-09 DIAGNOSIS — I10 Essential (primary) hypertension: Secondary | ICD-10-CM | POA: Diagnosis not present

## 2021-01-11 DIAGNOSIS — G459 Transient cerebral ischemic attack, unspecified: Secondary | ICD-10-CM | POA: Diagnosis not present

## 2021-01-11 DIAGNOSIS — D72819 Decreased white blood cell count, unspecified: Secondary | ICD-10-CM | POA: Diagnosis not present

## 2021-01-11 DIAGNOSIS — F419 Anxiety disorder, unspecified: Secondary | ICD-10-CM | POA: Diagnosis not present

## 2021-01-11 DIAGNOSIS — I10 Essential (primary) hypertension: Secondary | ICD-10-CM | POA: Diagnosis not present

## 2021-01-11 DIAGNOSIS — G629 Polyneuropathy, unspecified: Secondary | ICD-10-CM | POA: Diagnosis not present

## 2021-01-11 DIAGNOSIS — I2510 Atherosclerotic heart disease of native coronary artery without angina pectoris: Secondary | ICD-10-CM | POA: Diagnosis not present

## 2021-01-15 DIAGNOSIS — G459 Transient cerebral ischemic attack, unspecified: Secondary | ICD-10-CM | POA: Diagnosis not present

## 2021-01-17 DIAGNOSIS — I10 Essential (primary) hypertension: Secondary | ICD-10-CM | POA: Diagnosis not present

## 2021-01-17 DIAGNOSIS — D72819 Decreased white blood cell count, unspecified: Secondary | ICD-10-CM | POA: Diagnosis not present

## 2021-01-17 DIAGNOSIS — G459 Transient cerebral ischemic attack, unspecified: Secondary | ICD-10-CM | POA: Diagnosis not present

## 2021-01-17 DIAGNOSIS — G629 Polyneuropathy, unspecified: Secondary | ICD-10-CM | POA: Diagnosis not present

## 2021-01-17 DIAGNOSIS — F419 Anxiety disorder, unspecified: Secondary | ICD-10-CM | POA: Diagnosis not present

## 2021-01-17 DIAGNOSIS — I2510 Atherosclerotic heart disease of native coronary artery without angina pectoris: Secondary | ICD-10-CM | POA: Diagnosis not present

## 2021-01-22 DIAGNOSIS — F419 Anxiety disorder, unspecified: Secondary | ICD-10-CM | POA: Diagnosis not present

## 2021-01-22 DIAGNOSIS — Z7982 Long term (current) use of aspirin: Secondary | ICD-10-CM | POA: Diagnosis not present

## 2021-01-22 DIAGNOSIS — E7800 Pure hypercholesterolemia, unspecified: Secondary | ICD-10-CM | POA: Diagnosis not present

## 2021-01-22 DIAGNOSIS — Z7902 Long term (current) use of antithrombotics/antiplatelets: Secondary | ICD-10-CM | POA: Diagnosis not present

## 2021-01-22 DIAGNOSIS — D72819 Decreased white blood cell count, unspecified: Secondary | ICD-10-CM | POA: Diagnosis not present

## 2021-01-22 DIAGNOSIS — G459 Transient cerebral ischemic attack, unspecified: Secondary | ICD-10-CM | POA: Diagnosis not present

## 2021-01-22 DIAGNOSIS — I2510 Atherosclerotic heart disease of native coronary artery without angina pectoris: Secondary | ICD-10-CM | POA: Diagnosis not present

## 2021-01-22 DIAGNOSIS — E039 Hypothyroidism, unspecified: Secondary | ICD-10-CM | POA: Diagnosis not present

## 2021-01-22 DIAGNOSIS — I10 Essential (primary) hypertension: Secondary | ICD-10-CM | POA: Diagnosis not present

## 2021-01-22 DIAGNOSIS — R531 Weakness: Secondary | ICD-10-CM | POA: Diagnosis not present

## 2021-01-22 DIAGNOSIS — G629 Polyneuropathy, unspecified: Secondary | ICD-10-CM | POA: Diagnosis not present

## 2021-01-25 DIAGNOSIS — F419 Anxiety disorder, unspecified: Secondary | ICD-10-CM | POA: Diagnosis not present

## 2021-01-25 DIAGNOSIS — I10 Essential (primary) hypertension: Secondary | ICD-10-CM | POA: Diagnosis not present

## 2021-01-25 DIAGNOSIS — I2510 Atherosclerotic heart disease of native coronary artery without angina pectoris: Secondary | ICD-10-CM | POA: Diagnosis not present

## 2021-01-25 DIAGNOSIS — G629 Polyneuropathy, unspecified: Secondary | ICD-10-CM | POA: Diagnosis not present

## 2021-01-25 DIAGNOSIS — D72819 Decreased white blood cell count, unspecified: Secondary | ICD-10-CM | POA: Diagnosis not present

## 2021-01-25 DIAGNOSIS — G459 Transient cerebral ischemic attack, unspecified: Secondary | ICD-10-CM | POA: Diagnosis not present

## 2021-01-26 ENCOUNTER — Emergency Department (INDEPENDENT_AMBULATORY_CARE_PROVIDER_SITE_OTHER)
Admission: EM | Admit: 2021-01-26 | Discharge: 2021-01-26 | Disposition: A | Discharge status: Home / Self Care | Payer: Medicare Other | Source: Home / Self Care

## 2021-01-26 ENCOUNTER — Other Ambulatory Visit: Payer: Self-pay

## 2021-01-26 ENCOUNTER — Encounter: Payer: Self-pay | Admitting: Emergency Medicine

## 2021-01-26 DIAGNOSIS — L03115 Cellulitis of right lower limb: Secondary | ICD-10-CM | POA: Diagnosis not present

## 2021-01-26 MED ORDER — AMOXICILLIN-POT CLAVULANATE 875-125 MG PO TABS: 1.0000 | ORAL_TABLET | Freq: Two times a day (BID) | ORAL | 0 refills | Status: AC

## 2021-01-26 NOTE — ED Provider Notes (Signed)
[facility]    CSN: [id number] Arrival date & time: 01/26/21  1351      History   Chief Complaint Chief Complaint  Patient presents with   Leg Pain    right    HPI Russell Neal is a 80 y.o. male.   HPIVery pleasant 80 year old male presents with swelling to right lower extremity and rash for approximately 2 weeks.  Patient and wife report being on Plavix for 2 weeks in November due to suspected TIA.  Patient's wife (accompanying patient today) reports is no longer on this medication.  PMH significant for CAD in native artery, PVD and venous stasis dermatitis of both lower extremities.  Past Medical History:  Diagnosis Date   Hyperlipidemia    Hypertension     Patient Active Problem List   Diagnosis Date Noted   Memory change 12/18/2020   Frequent falls 10/06/2020   Right renal stone 07/17/2020   Cholelithiases 07/17/2020   Unspecified mood (affective) disorder ([place]) 05/24/2020   Subclinical hyperthyroidism 12/29/2017   Mood change 12/29/2017   CAD in native artery 07/17/2017   PVD (peripheral vascular disease) ([place]) 07/17/2017   Chronic constipation 06/02/2017   Complete hearing loss, right 04/19/2016   Venous stasis dermatitis of both lower extremities 09/29/2015   Primary osteoarthritis of both knees 05/16/2015   Hemorrhagic prepatellar bursitis of left knee 02/03/2014   Quadriceps weakness 02/18/2013   HYPERLIPIDEMIA 03/23/2008   Essential hypertension 03/23/2008    History reviewed. No pertinent surgical history.     Home Medications    Prior to Admission medications   Medication Sig Start Date End Date Taking? Authorizing Provider  amoxicillin-clavulanate (AUGMENTIN) 875-125 MG tablet Take 1 tablet by mouth every 12 (twelve) hours for 10 days. 01/26/21 02/05/21 Yes Eliezer Lofts, FNP  aspirin 81 MG tablet Take 81 mg by mouth daily.    [provider]  Cyanocobalamin (B-12 PO) Take by mouth. Twice a day.    [provider]   methimazole (TAPAZOLE) 5 MG tablet TAKE ONE-HALF TABLET BY MOUTH ONCE DAILY 10/11/15   [provider]  metoprolol succinate (TOPROL-XL) 50 MG 24 hr tablet Take by mouth. 08/28/18   [provider]  Omega-3 Fatty Acids (FISH OIL) 1000 MG CAPS Take 2 capsules by mouth daily.    [provider]  polyethylene glycol powder (GLYCOLAX/MIRALAX) 17 GM/SCOOP powder Take by mouth. Patient not taking: Reported on 01/26/2021 08/22/20   [provider]  REPATHA SURECLICK 329 MG/ML SOAJ Inject 1 Syringe into the skin every 14 (fourteen) days. 11/23/20   [provider]  sertraline (ZOLOFT) 50 MG tablet TAKE 1 TABLET BY MOUTH  DAILY 10/09/20   Hali Marry, MD    Family History History reviewed. No pertinent family history.  Social History Social History   Tobacco Use   Smoking status: Former    Types: Cigarettes    Quit date: 02/18/1973    Years since quitting: 47.9   Smokeless tobacco: Never  Vaping Use   Vaping Use: Never used  Substance Use Topics   Alcohol use: Not Currently    Alcohol/week: 1.0 standard drink    Types: 1 Cans of beer per week   Drug use: Never     Allergies   Atorvastatin and Simvastatin   Review of Systems Review of Systems  Musculoskeletal:        Right lower extremity swelling and rash for 2 weeks.  All other systems reviewed and are negative.   Physical  Exam Triage Vital Signs ED Triage Vitals  Enc Vitals Group     BP 01/26/21 1433 108/71     Pulse Rate 01/26/21 1433 (!) 56     Resp 01/26/21 1433 18     Temp 01/26/21 1433 98.5 F (36.9 C)     Temp Source 01/26/21 1433 Oral     SpO2 01/26/21 1433 96 %     Weight 01/26/21 1435 164 lb (74.4 kg)     Height 01/26/21 1435 6\' 1"  (1.854 m)     Head Circumference --      Peak Flow --      Pain Score 01/26/21 1435 0     Pain Loc --      Pain Edu? --      Excl. in [place]? --    No data found.  Updated Vital Signs BP 108/71 (BP Location: Right Arm)     Pulse (!) 56    Temp 98.5 F (36.9 C) (Oral)    Resp 18    Ht 6\' 1"  (1.854 m)    Wt 164 lb (74.4 kg)    SpO2 96%    BMI 21.64 kg/m       Physical Exam Vitals and nursing note reviewed.  Constitutional:      Appearance: Normal appearance. He is normal weight.  HENT:     Mouth/Throat:     Mouth: Mucous membranes are moist.     Pharynx: Oropharynx is clear.  Eyes:     Extraocular Movements: Extraocular movements intact.     Conjunctiva/sclera: Conjunctivae normal.     Pupils: Pupils are equal, round, and reactive to light.  Cardiovascular:     Rate and Rhythm: Normal rate and regular rhythm.     Pulses: Normal pulses.     Heart sounds: Normal heart sounds.  Pulmonary:     Effort: Pulmonary effort is normal.     Breath sounds: Normal breath sounds.  Musculoskeletal:     Cervical back: Normal range of motion and neck supple.  Skin:    General: Skin is warm and dry.     Comments: Right lower leg/dorsum of right foot: Erythematous, mildly indurated maculopapular eruption, with mild soft tissue swelling of dorsum of right foot noted; TP/DP palpable  Neurological:     General: No focal deficit present.     Mental Status: He is alert and oriented to person, place, and time.     UC Treatments / Results  Labs (all labs ordered are listed, but only abnormal results are displayed) Labs Reviewed - No data to display  EKG   Radiology No results found.  Procedures Procedures (including critical care time)  Medications Ordered in UC Medications - No data to display  Initial Impression / Assessment and Plan / UC Course  I have reviewed the triage vital signs and the nursing notes.  Pertinent labs & imaging results that were available during my care of the patient were reviewed by me and considered in my medical decision making (see chart for details).     MDM: 1.  Cellulitis of right leg-Rx'd Augmentin. Advised patient to take medication as directed with food to completion.   Encouraged patient increase daily water intake while taking this medication.  Patient discharged home, hemodynamically stable. Final Clinical Impressions(s) / UC Diagnoses   Final diagnoses:  Cellulitis of leg, right     Discharge Instructions      Advised patient to take medication as directed with food to completion.  Encouraged patient increase daily water intake while taking this medication.     ED Prescriptions     Medication Sig Dispense Auth. Provider   amoxicillin-clavulanate (AUGMENTIN) 875-125 MG tablet Take 1 tablet by mouth every 12 (twelve) hours for 10 days. 20 tablet Eliezer Lofts, FNP      PDMP not reviewed this encounter.   Eliezer Lofts, [place] 01/26/21 1527

## 2021-01-26 NOTE — ED Triage Notes (Signed)
Her w/ his wife  Pt was on plavix for 2 weeks in November  Swelling to RLE then  Rash noted to RLE approx 2 weeks ago  No pain - discoloration noted

## 2021-01-26 NOTE — Discharge Instructions (Addendum)
Advised patient to take medication as directed with food to completion.  Encouraged patient increase daily water intake while taking this medication.

## 2021-02-01 DIAGNOSIS — M6281 Muscle weakness (generalized): Secondary | ICD-10-CM | POA: Diagnosis not present

## 2021-02-01 DIAGNOSIS — D72819 Decreased white blood cell count, unspecified: Secondary | ICD-10-CM | POA: Diagnosis not present

## 2021-02-01 DIAGNOSIS — I959 Hypotension, unspecified: Secondary | ICD-10-CM | POA: Diagnosis not present

## 2021-02-01 DIAGNOSIS — M1990 Unspecified osteoarthritis, unspecified site: Secondary | ICD-10-CM | POA: Diagnosis not present

## 2021-02-01 DIAGNOSIS — E039 Hypothyroidism, unspecified: Secondary | ICD-10-CM | POA: Diagnosis not present

## 2021-02-01 DIAGNOSIS — I340 Nonrheumatic mitral (valve) insufficiency: Secondary | ICD-10-CM | POA: Diagnosis not present

## 2021-02-01 DIAGNOSIS — I2510 Atherosclerotic heart disease of native coronary artery without angina pectoris: Secondary | ICD-10-CM | POA: Diagnosis not present

## 2021-02-01 DIAGNOSIS — G629 Polyneuropathy, unspecified: Secondary | ICD-10-CM | POA: Diagnosis not present

## 2021-02-01 DIAGNOSIS — I739 Peripheral vascular disease, unspecified: Secondary | ICD-10-CM | POA: Diagnosis not present

## 2021-02-01 DIAGNOSIS — Z951 Presence of aortocoronary bypass graft: Secondary | ICD-10-CM | POA: Diagnosis not present

## 2021-02-01 DIAGNOSIS — I10 Essential (primary) hypertension: Secondary | ICD-10-CM | POA: Diagnosis not present

## 2021-02-01 DIAGNOSIS — F419 Anxiety disorder, unspecified: Secondary | ICD-10-CM | POA: Diagnosis not present

## 2021-02-01 DIAGNOSIS — E7800 Pure hypercholesterolemia, unspecified: Secondary | ICD-10-CM | POA: Diagnosis not present

## 2021-02-01 DIAGNOSIS — G459 Transient cerebral ischemic attack, unspecified: Secondary | ICD-10-CM | POA: Diagnosis not present

## 2021-02-01 DIAGNOSIS — K219 Gastro-esophageal reflux disease without esophagitis: Secondary | ICD-10-CM | POA: Diagnosis not present

## 2021-02-01 DIAGNOSIS — N529 Male erectile dysfunction, unspecified: Secondary | ICD-10-CM | POA: Diagnosis not present

## 2021-02-08 ENCOUNTER — Ambulatory Visit: Payer: Medicare Other | Admitting: Family Medicine

## 2021-03-06 ENCOUNTER — Encounter: Payer: Self-pay | Admitting: Family Medicine

## 2021-03-06 ENCOUNTER — Ambulatory Visit (INDEPENDENT_AMBULATORY_CARE_PROVIDER_SITE_OTHER): Payer: Medicare Other | Admitting: Family Medicine

## 2021-03-06 ENCOUNTER — Other Ambulatory Visit: Payer: Self-pay

## 2021-03-06 VITALS — BP 133/74 | HR 50 | Resp 18 | Ht 73.0 in | Wt 162.0 lb

## 2021-03-06 DIAGNOSIS — E039 Hypothyroidism, unspecified: Secondary | ICD-10-CM | POA: Diagnosis not present

## 2021-03-06 DIAGNOSIS — I959 Hypotension, unspecified: Secondary | ICD-10-CM | POA: Diagnosis not present

## 2021-03-06 DIAGNOSIS — Z951 Presence of aortocoronary bypass graft: Secondary | ICD-10-CM | POA: Diagnosis not present

## 2021-03-06 DIAGNOSIS — E7800 Pure hypercholesterolemia, unspecified: Secondary | ICD-10-CM | POA: Diagnosis not present

## 2021-03-06 DIAGNOSIS — N529 Male erectile dysfunction, unspecified: Secondary | ICD-10-CM | POA: Diagnosis not present

## 2021-03-06 DIAGNOSIS — I2510 Atherosclerotic heart disease of native coronary artery without angina pectoris: Secondary | ICD-10-CM | POA: Diagnosis not present

## 2021-03-06 DIAGNOSIS — K219 Gastro-esophageal reflux disease without esophagitis: Secondary | ICD-10-CM | POA: Diagnosis not present

## 2021-03-06 DIAGNOSIS — M1990 Unspecified osteoarthritis, unspecified site: Secondary | ICD-10-CM | POA: Diagnosis not present

## 2021-03-06 DIAGNOSIS — I340 Nonrheumatic mitral (valve) insufficiency: Secondary | ICD-10-CM | POA: Diagnosis not present

## 2021-03-06 DIAGNOSIS — D72819 Decreased white blood cell count, unspecified: Secondary | ICD-10-CM | POA: Diagnosis not present

## 2021-03-06 DIAGNOSIS — I739 Peripheral vascular disease, unspecified: Secondary | ICD-10-CM | POA: Diagnosis not present

## 2021-03-06 DIAGNOSIS — G629 Polyneuropathy, unspecified: Secondary | ICD-10-CM | POA: Diagnosis not present

## 2021-03-06 DIAGNOSIS — F419 Anxiety disorder, unspecified: Secondary | ICD-10-CM | POA: Diagnosis not present

## 2021-03-06 DIAGNOSIS — M6281 Muscle weakness (generalized): Secondary | ICD-10-CM | POA: Diagnosis not present

## 2021-03-06 DIAGNOSIS — G459 Transient cerebral ischemic attack, unspecified: Secondary | ICD-10-CM | POA: Diagnosis not present

## 2021-03-06 DIAGNOSIS — I10 Essential (primary) hypertension: Secondary | ICD-10-CM | POA: Diagnosis not present

## 2021-03-06 DIAGNOSIS — H6123 Impacted cerumen, bilateral: Secondary | ICD-10-CM | POA: Diagnosis not present

## 2021-03-06 NOTE — Progress Notes (Signed)
Acute Office Visit  Subjective:    Patient ID: Russell Neal, male    DOB: [birth year], 81 y.o.   MRN: [id number]  Chief Complaint  Patient presents with   Cerumen Impaction    Left ear. Patient states he is unable to hear out of his right ear.     HPI Patient is in today for unable to hear out of left ear. Says hasn't been hearing well. Has had fullness.  He has had a mild ST. No cough or other symptoms.    Past Medical History:  Diagnosis Date   Hyperlipidemia    Hypertension     No past surgical history on file.  No family history on file.  Social History   Socioeconomic History   Marital status: Married    Spouse name: [name]   Number of children: 1   Years of education: 12   Highest education level: 12th grade  Occupational History   Occupation: retired    Comment: Nurse, learning disability  Tobacco Use   Smoking status: Former    Types: Cigarettes    Quit date: 02/18/1973    Years since quitting: 48.0   Smokeless tobacco: Never  Vaping Use   Vaping Use: Never used  Substance and Sexual Activity   Alcohol use: Not Currently    Alcohol/week: 1.0 standard drink    Types: 1 Cans of beer per week   Drug use: Never   Sexual activity: Not Currently  Other Topics Concern   Not on file  Social History Narrative   Lives with his wife. He enjoys watching [employer].    Social Determinants of Health   Financial Resource Strain: Low Risk    Difficulty of Paying Living Expenses: Not hard at all  Food Insecurity: No Food Insecurity   Worried About Charity fundraiser in the Last Year: Never true   [place] in the Last Year: Never true  Transportation Needs: No Transportation Needs   Lack of Transportation (Medical): No   Lack of Transportation (Non-Medical): No  Physical Activity: Sufficiently Active   Days of Exercise per Week: 3 days   Minutes of Exercise per Session: 60 min  Stress: No Stress Concern Present   Feeling of Stress : Not at all  Social Connections:  Socially Isolated   Frequency of Communication with Friends and Family: Once a week   Frequency of Social Gatherings with Friends and Family: Once a week   Attends Religious Services: Never   Marine scientist or Organizations: No   Attends Music therapist: Never   Marital Status: Married  Human resources officer Violence: Not At Risk   Fear of Current or Ex-Partner: No   Emotionally Abused: No   Physically Abused: No   Sexually Abused: No    Outpatient Medications Prior to Visit  Medication Sig Dispense Refill   aspirin 81 MG tablet Take 81 mg by mouth daily.     methimazole (TAPAZOLE) 5 MG tablet TAKE ONE-HALF TABLET BY MOUTH ONCE DAILY     metoprolol succinate (TOPROL-XL) 50 MG 24 hr tablet Take by mouth.     Omega-3 Fatty Acids (FISH OIL) 1000 MG CAPS Take 2 capsules by mouth daily.     polyethylene glycol powder (GLYCOLAX/MIRALAX) 17 GM/SCOOP powder Take by mouth.     REPATHA SURECLICK 132 MG/ML SOAJ Inject 1 Syringe into the skin every 14 (fourteen) days.     sertraline (ZOLOFT) 50 MG tablet TAKE 1 TABLET BY  MOUTH  DAILY 90 tablet 3   Cyanocobalamin (B-12 PO) Take by mouth. Twice a day.     No facility-administered medications prior to visit.    Allergies  Allergen Reactions   Atorvastatin Other (See Comments)    Muscle weakness   Simvastatin Other (See Comments)    Muscle weakness    Review of Systems     Objective:    Physical Exam Vitals reviewed.  Constitutional:      Appearance: Normal appearance.  HENT:     Head:     Comments: Bilateral cerumen impaction.  After removal TMs and canals are clear bilaterally. Neurological:     Mental Status: He is alert.    BP 133/74    Pulse (!) 50    Resp 18    Ht 6\' 1"  (1.854 m)    SpO2 98%    BMI 21.64 kg/m  Wt Readings from Last 3 Encounters:  01/26/21 164 lb (74.4 kg)  12/18/20 164 lb (74.4 kg)  07/17/20 166 lb (75.3 kg)    Health Maintenance Due  Topic Date Due   COVID-19 Vaccine (3 - Moderna  risk series) 12/03/2019    There are no preventive care reminders to display for this patient.   No results found for: TSH Lab Results  Component Value Date   WBC 6.9 06/28/2016   HGB 14.8 06/28/2016   HCT 42.3 06/28/2016   MCV 94.6 06/28/2016   PLT 180 06/28/2016   Lab Results  Component Value Date   NA 137 06/28/2016   K 5.0 06/28/2016   CO2 28 06/28/2016   GLUCOSE 89 06/28/2016   BUN 18 06/28/2016   CREATININE 0.50 (L) 06/28/2016   BILITOT 0.6 06/28/2016   ALKPHOS 70 06/28/2016   AST 27 06/28/2016   ALT 31 06/28/2016   PROT 6.6 06/28/2016   ALBUMIN 4.2 06/28/2016   CALCIUM 9.6 06/28/2016   Lab Results  Component Value Date   CHOL 108 10/28/2017   Lab Results  Component Value Date   HDL 32 (L) 10/28/2017   Lab Results  Component Value Date   LDLCALC 51 10/28/2017   Lab Results  Component Value Date   TRIG 170 (H) 10/28/2017   Lab Results  Component Value Date   CHOLHDL 3.4 10/28/2017   Lab Results  Component Value Date   HGBA1C 5.3 10/28/2017       Assessment & Plan:   Problem List Items Addressed This Visit   None Visit Diagnoses     Bilateral hearing loss due to cerumen impaction    -  Primary      We did remove cerumen from both ears which I think will help.  But he also sounds like he may be having a little bit of allergy symptoms with a mild sore throat and a little bit of postnasal drip that is been going on for couple of weeks it does not Solik is had any active cold symptoms.  We did discuss possibly doing a nasal steroid spray versus an antihistamine pros and cons I do think taking 1 of those for the next couple of weeks would be helpful avoid decongestants.  And if at any point he is not improving or he develops new or worsening symptoms then please let us know.  Indication: Cerumen impaction of the ear(s) Medical necessity statement: On physical examination, cerumen impairs clinically significant portions of the external auditory  canal, and tympanic membrane. Noted obstructive, copious cerumen that cannot be removed without  magnification and instrumentations requiring physician skills Consent: Discussed benefits and risks of procedure and verbal consent obtained Procedure: Patient was prepped for the procedure. Utilized an otoscope to assess and take note of the ear canal, the tympanic membrane, and the presence, amount, and placement of the cerumen. Gentle water irrigation and soft plastic curette was utilized to remove cerumen.  Post procedure examination: shows cerumen was completely removed. Patient tolerated procedure well. The patient is made aware that they may experience temporary vertigo, temporary hearing loss, and temporary discomfort. If these symptom last for more than 24 hours to call the clinic or proceed to the ED.     No orders of the defined types were placed in this encounter.    Beatrice Lecher, MD

## 2021-03-28 DIAGNOSIS — E0590 Thyrotoxicosis, unspecified without thyrotoxic crisis or storm: Secondary | ICD-10-CM | POA: Diagnosis not present

## 2021-05-10 ENCOUNTER — Encounter: Payer: Self-pay | Admitting: Emergency Medicine

## 2021-05-10 ENCOUNTER — Emergency Department (INDEPENDENT_AMBULATORY_CARE_PROVIDER_SITE_OTHER)
Admission: EM | Admit: 2021-05-10 | Discharge: 2021-05-10 | Disposition: A | Discharge status: Home / Self Care | Payer: Medicare Other | Source: Home / Self Care

## 2021-05-10 DIAGNOSIS — R350 Frequency of micturition: Secondary | ICD-10-CM | POA: Diagnosis not present

## 2021-05-10 LAB — POCT URINALYSIS DIP (MANUAL ENTRY)
Bilirubin, UA: NEGATIVE
Blood, UA: NEGATIVE
Glucose, UA: NEGATIVE mg/dL
Ketones, POC UA: NEGATIVE mg/dL
Leukocytes, UA: NEGATIVE
Nitrite, UA: NEGATIVE
Protein Ur, POC: NEGATIVE mg/dL
Spec Grav, UA: 1.02 (ref 1.010–1.025)
Urobilinogen, UA: 1 E.U./dL
pH, UA: 6.5 (ref 5.0–8.0)

## 2021-05-10 NOTE — ED Provider Notes (Signed)
?[facility] ? ? ? ?CSN: [id number] ?Arrival date & time: 05/10/21  1011 ? ? ?  ? ?History   ?Chief Complaint ?Chief Complaint  ?Patient presents with  ? Urinary Frequency  ? Altered Mental Status  ? ? ?HPI ?Russell Neal is a 81 y.o. male.  ? ?HPI 81 year old male presents to urgent care with disorientation and increased urinary urgency for 1 day.  Wife reports patient is unable to stand up by himself.  Patient is seated comfortably in wheelchair today at onset of my exam.  PMH significant for subclinical hyperthyroidism, weakness, memory change, frequent falls, mood change, PVD. ? ?Past Medical History:  ?Diagnosis Date  ? Hyperlipidemia   ? Hypertension   ? ? ?Patient Active Problem List  ? Diagnosis Date Noted  ? Memory change 12/18/2020  ? Frequent falls 10/06/2020  ? Right renal stone 07/17/2020  ? Cholelithiases 07/17/2020  ? Unspecified mood (affective) disorder ([place]) 05/24/2020  ? Subclinical hyperthyroidism 12/29/2017  ? Mood change 12/29/2017  ? CAD in native artery 07/17/2017  ? PVD (peripheral vascular disease) ([place]) 07/17/2017  ? Chronic constipation 06/02/2017  ? Complete hearing loss, right 04/19/2016  ? Venous stasis dermatitis of both lower extremities 09/29/2015  ? Primary osteoarthritis of both knees 05/16/2015  ? Hemorrhagic prepatellar bursitis of left knee 02/03/2014  ? Quadriceps weakness 02/18/2013  ? HYPERLIPIDEMIA 03/23/2008  ? Essential hypertension 03/23/2008  ? ? ?History reviewed. No pertinent surgical history. ? ? ? ? ?Home Medications   ? ?Prior to Admission medications   ?Medication Sig Start Date End Date Taking? Authorizing Provider  ?methimazole (TAPAZOLE) 5 MG tablet TAKE ONE-HALF TABLET BY MOUTH ONCE DAILY 10/11/15  Yes [provider]  ?metoprolol succinate (TOPROL-XL) 50 MG 24 hr tablet Take by mouth. 08/28/18  Yes [provider]  ?Omega-3 Fatty Acids (FISH OIL) 1000 MG CAPS Take 2 capsules by mouth daily.   Yes [provider]  ?REPATHA  SURECLICK 010 MG/ML SOAJ Inject 1 Syringe into the skin every 14 (fourteen) days. 11/23/20  Yes [provider]  ?sertraline (ZOLOFT) 50 MG tablet TAKE 1 TABLET BY MOUTH  DAILY 10/09/20  Yes Hali Marry, MD  ?aspirin 81 MG tablet Take 81 mg by mouth daily.    [provider]  ?polyethylene glycol powder (GLYCOLAX/MIRALAX) 17 GM/SCOOP powder Take by mouth. 08/22/20   [provider]  ? ? ?Family History ?History reviewed. No pertinent family history. ? ?Social History ?Social History  ? ?Tobacco Use  ? Smoking status: Former  ?  Types: Cigarettes  ?  Quit date: 02/18/1973  ?  Years since quitting: 48.2  ? Smokeless tobacco: Never  ?Vaping Use  ? Vaping Use: Never used  ?Substance Use Topics  ? Alcohol use: Not Currently  ?  Alcohol/week: 1.0 standard drink  ?  Types: 1 Cans of beer per week  ? Drug use: Never  ? ? ? ?Allergies   ?Atorvastatin and Simvastatin ? ? ?Review of Systems ?Review of Systems  ?Genitourinary:  Positive for frequency and urgency.  ?All other systems reviewed and are negative. ? ? ?Physical Exam ?Triage Vital Signs ?ED Triage Vitals  ?Enc Vitals Group  ?   BP 05/10/21 1030 112/67  ?   Pulse Rate 05/10/21 1030 (!) 58  ?   Resp 05/10/21 1030 16  ?   Temp 05/10/21 1030 98.6 ?F (37 ?C)  ?   Temp Source 05/10/21 1030 Oral  ?   SpO2 05/10/21 1030 96 %  ?  Weight --   ?   Height --   ?   Head Circumference --   ?   Peak Flow --   ?   Pain Score 05/10/21 1028 0  ?   Pain Loc --   ?   Pain Edu? --   ?   Excl. in [place]? --   ? ?No data found. ? ?Updated Vital Signs ?BP 112/67 (BP Location: Left Arm)   Pulse (!) 58   Temp 98.6 ?F (37 ?C) (Oral)   Resp 16   SpO2 96%  ? ?  ? ?Physical Exam ?Vitals and nursing note reviewed.  ?Constitutional:   ?   Appearance: Normal appearance. He is normal weight.  ?HENT:  ?   Head: Normocephalic and atraumatic.  ?   Right Ear: Tympanic membrane, ear canal and external ear normal.  ?   Left Ear: Tympanic membrane, ear canal and external  ear normal.  ?   Mouth/Throat:  ?   Mouth: Mucous membranes are moist.  ?   Pharynx: Oropharynx is clear.  ?Eyes:  ?   Extraocular Movements: Extraocular movements intact.  ?   Conjunctiva/sclera: Conjunctivae normal.  ?   Pupils: Pupils are equal, round, and reactive to light.  ?Cardiovascular:  ?   Rate and Rhythm: Normal rate and regular rhythm.  ?   Pulses: Normal pulses.  ?   Heart sounds: Normal heart sounds.  ?Pulmonary:  ?   Effort: Pulmonary effort is normal.  ?   Breath sounds: Normal breath sounds. No wheezing, rhonchi or rales.  ?Abdominal:  ?   Tenderness: There is no right CVA tenderness or left CVA tenderness.  ?Musculoskeletal:  ?   Cervical back: Normal range of motion and neck supple.  ?Skin: ?   General: Skin is warm and dry.  ?Neurological:  ?   General: No focal deficit present.  ?   Mental Status: He is alert and oriented to person, place, and time. Mental status is at baseline.  ?   Comments: Patient seated comfortably in wheelchair today.  ? ? ? ?UC Treatments / Results  ?Labs ?(all labs ordered are listed, but only abnormal results are displayed) ?Labs Reviewed  ?POCT URINALYSIS DIP (MANUAL ENTRY) - Normal  ? ? ?EKG ? ? ?Radiology ?No results found. ? ?Procedures ?Procedures (including critical care time) ? ?Medications Ordered in UC ?Medications - No data to display ? ?Initial Impression / Assessment and Plan / UC Course  ?I have reviewed the triage vital signs and the nursing notes. ? ?Pertinent labs & imaging results that were available during my care of the patient were reviewed by me and considered in my medical decision making (see chart for details). ? ?  ? ?MDM: 1. Urinary frequency-UA was unremarkable, completely within normal limits. Advised patient and wife urine was completely clean/urinalysis was perfect within normal limits and did not require urinary culture.  Advised patient/wife if symptoms return please follow-up with PCP or here for further evaluation.  Patient discharged  home, hemodynamically stable. ?Final Clinical Impressions(s) / UC Diagnoses  ? ?Final diagnoses:  ?Urinary frequency  ? ? ? ?Discharge Instructions   ? ?  ?Advised patient and wife urine was completely clean/urinalysis was perfect within normal limits and did not require urinary culture.  Advised patient/wife if symptoms return please follow-up with PCP or here for further evaluation. ? ? ? ? ?ED Prescriptions   ?None ?  ? ?PDMP not reviewed this encounter. ?  ?Eliezer Lofts,  FNP ?05/10/21 1147 ? ?

## 2021-05-10 NOTE — ED Triage Notes (Addendum)
Patient presents to Urgent Care with complaints of disoriented and increased urinary urgency since 1 day ago. Patient reports wife states he is unable to stand up by himself. Had to use the bathroom a lot last night(using the urinal) and became very confused last night. Denies any dysuria ?

## 2021-05-10 NOTE — Discharge Instructions (Addendum)
Advised patient and wife urine was completely clean/urinalysis was perfect within normal limits and did not require urinary culture.  Advised patient/wife if symptoms return please follow-up with PCP or here for further evaluation. ?

## 2021-07-03 ENCOUNTER — Ambulatory Visit (INDEPENDENT_AMBULATORY_CARE_PROVIDER_SITE_OTHER): Payer: Medicare Other | Admitting: Pharmacist

## 2021-07-03 DIAGNOSIS — I10 Essential (primary) hypertension: Secondary | ICD-10-CM

## 2021-07-03 DIAGNOSIS — E785 Hyperlipidemia, unspecified: Secondary | ICD-10-CM

## 2021-07-03 DIAGNOSIS — R29818 Other symptoms and signs involving the nervous system: Secondary | ICD-10-CM

## 2021-07-03 NOTE — Progress Notes (Signed)
Chronic Care Management Pharmacy Note  07/03/2021 Name:  Russell Neal MRN:  [id number] DOB:  1940/02/25  Summary: addressed HTN, HLD.  Patient had a TIA in November 2022, we reviewed secondary prevention medications.  Recommendations/Changes made from today's visit: no changes, patient is doing well.  Plan: f/u with pharmacist in 1 year  Subjective: Russell Neal is an 81 y.o. year old male who is a primary patient of [name], Rene Kocher, MD.  The CCM team was consulted for assistance with disease management and care coordination needs.    Engaged with patient by telephone for follow up visit in response to provider referral for pharmacy case management and/or care coordination services.   Consent to Services:  The patient was given information about Chronic Care Management services, agreed to services, and gave verbal consent prior to initiation of services.  Please see initial visit note for detailed documentation.   Patient Care Team: Hali Marry, MD as PCP - General (Family Medicine) Darius Bump, [facility] as Pharmacist (Pharmacist)  Recent office visits: 05/24/20- Dr. Madilyn Fireman (PCP): diarrhea  Recent consult visits: 03/28/20 - Susette Racer (endocrinology): hyperthyroidism f/u  Hospital visits: 06/11/20 - ED visit for bilateral ear fullness, hearing loss: impacted cerumen  Objective:  Lab Results  Component Value Date   CREATININE 0.50 (L) 06/28/2016   CREATININE 0.48 (L) 02/29/2016    Lab Results  Component Value Date   HGBA1C 5.3 10/28/2017   Last diabetic Eye exam: No results found for: HMDIABEYEEXA  Last diabetic Foot exam: No results found for: HMDIABFOOTEX      Component Value Date/Time   CHOL 108 10/28/2017 1438   TRIG 170 (H) 10/28/2017 1438   HDL 32 (L) 10/28/2017 1438   CHOLHDL 3.4 10/28/2017 1438   LDLCALC 51 10/28/2017 1438       Latest Ref Rng & Units 06/28/2016   10:02 AM  Hepatic Function  Total Protein 6.1 - 8.1 g/dL 6.6    Albumin  3.6 - 5.1 g/dL 4.2    AST 10 - 35 U/L 27    ALT 9 - 46 U/L 31    Alk Phosphatase 40 - 115 U/L 70    Total Bilirubin 0.2 - 1.2 mg/dL 0.6      No results found for: TSH, FREET4     Latest Ref Rng & Units 06/28/2016   10:02 AM  CBC  WBC 3.8 - 10.8 K/uL 6.9    Hemoglobin 13.2 - 17.1 g/dL 14.8    Hematocrit 38.5 - 50.0 % 42.3    Platelets 140 - 400 K/uL 180       Social History   Tobacco Use  Smoking Status Former   Types: Cigarettes   Quit date: 02/18/1973   Years since quitting: 48.4  Smokeless Tobacco Never   BP Readings from Last 3 Encounters:  05/10/21 112/67  03/06/21 133/74  01/26/21 108/71   Pulse Readings from Last 3 Encounters:  05/10/21 (!) 58  03/06/21 (!) 50  01/26/21 (!) 56   Wt Readings from Last 3 Encounters:  03/06/21 162 lb (73.5 kg)  01/26/21 164 lb (74.4 kg)  12/18/20 164 lb (74.4 kg)    Assessment: Review of patient past medical history, allergies, medications, health status, including review of consultants reports, laboratory and other test data, was performed as part of comprehensive evaluation and provision of chronic care management services.   SDOH:  (Social Determinants of Health) assessments and interventions performed:    [place]  Allergies  Allergen Reactions   Atorvastatin Other (See Comments)    Muscle weakness   Simvastatin Other (See Comments)    Muscle weakness    Medications Reviewed Today     Reviewed by Eliezer Lofts, FNP (Family Nurse Practitioner) on 05/10/21 at 1123  Med List Status: <None>   Medication Order Taking? Sig Documenting Provider Last Dose Status Informant  aspirin 81 MG tablet [id number]  Take 81 mg by mouth daily. [provider]  Active   methimazole (TAPAZOLE) 5 MG tablet [id number] Yes TAKE ONE-HALF TABLET BY MOUTH ONCE DAILY [provider] 05/10/2021 Active            Med Note Maryruth Eve, TONYA L   Mon Aug 07, 2020  1:15 PM)    metoprolol succinate (TOPROL-XL) 50 MG 24 hr tablet  [id number] Yes Take by mouth. [provider] 05/10/2021 Active   Omega-3 Fatty Acids (FISH OIL) 1000 MG CAPS [id number] Yes Take 2 capsules by mouth daily. [provider] 05/10/2021 Active   polyethylene glycol powder (GLYCOLAX/MIRALAX) 17 GM/SCOOP powder [id number]  Take by mouth. [provider]  Active Spouse/Significant Other  REPATHA SURECLICK 950 MG/ML SOAJ [id number] Yes Inject 1 Syringe into the skin every 14 (fourteen) days. [provider] 05/10/2021 Active   sertraline (ZOLOFT) 50 MG tablet [id number] Yes TAKE 1 TABLET BY MOUTH  DAILY Hali Marry, MD 05/10/2021 Active             Patient Active Problem List   Diagnosis Date Noted   Memory change 12/18/2020   Frequent falls 10/06/2020   Right renal stone 07/17/2020   Cholelithiases 07/17/2020   Unspecified mood (affective) disorder ([place]) 05/24/2020   Subclinical hyperthyroidism 12/29/2017   Mood change 12/29/2017   CAD in native artery 07/17/2017   PVD (peripheral vascular disease) ([place]) 07/17/2017   Chronic constipation 06/02/2017   Complete hearing loss, right 04/19/2016   Venous stasis dermatitis of both lower extremities 09/29/2015   Primary osteoarthritis of both knees 05/16/2015   Hemorrhagic prepatellar bursitis of left knee 02/03/2014   Quadriceps weakness 02/18/2013   HYPERLIPIDEMIA 03/23/2008   Essential hypertension 03/23/2008    Immunization History  Administered Date(s) Administered   Fluad Quad(high Dose 65+) 09/16/2018, 10/04/2020   Influenza, High Dose Seasonal PF 10/13/2015, 10/21/2016   Influenza, Seasonal, Injecte, Preservative Fre 11/29/2014   Influenza,inj,Quad PF,6+ Mos 10/28/2017   Influenza-Unspecified 11/29/2014   Moderna Sars-Covid-2 Vaccination 10/08/2019, 11/05/2019   Pneumococcal Conjugate-13 01/16/2017   Pneumococcal Polysaccharide-23 01/29/2015   Tdap 01/29/2015, 08/19/2018   Zoster Recombinat (Shingrix) 10/22/2016, 02/12/2017    Conditions  to be addressed/monitored: HTN and HLD  There are no care plans that you recently modified to display for this patient.    Medication Assistance: None required.  Patient affirms current coverage meets needs.  Patient's preferred pharmacy is:  [facility] [address] [place] [address] Phone: [phone] Fax: [phone]  [facility] ([place], [place] [facility] [address] [place] [address] Phone: [phone] Fax: [phone]  [facility] ([facility] ) - [place], [place] [address] Phone: [phone] Fax: [phone]  Uses pill box? No - keeps a list + cupboard dedicated to medicines Pt endorses 100% compliance  Follow Up:  Patient agrees to Care Plan and Follow-up.  Plan: Telephone follow  up appointment with care management team member scheduled for:  1 year  Larinda Buttery, PharmD Clinical Pharmacist [facility] [phone]

## 2021-07-03 NOTE — Patient Instructions (Signed)
Visit Information  Thank you for taking time to visit with me today. Please don't hesitate to contact me if I can be of assistance to you before our next scheduled telephone appointment.  Following are the goals we discussed today:   Patient Goals/Self-Care Activities Over the next 365 days, patient will:  take medications as prescribed  Follow Up Plan: Telephone follow up appointment with care management team member scheduled for:  1 year  Please call the care guide team at [phone] if you need to cancel or reschedule your appointment.    Patient verbalizes understanding of instructions and care plan provided today and agrees to view in MyChart. Active MyChart status and patient understanding of how to access instructions and care plan via MyChart confirmed with patient.     Russell Neal  

## 2021-07-09 DIAGNOSIS — Z85820 Personal history of malignant melanoma of skin: Secondary | ICD-10-CM | POA: Diagnosis not present

## 2021-07-09 DIAGNOSIS — Z129 Encounter for screening for malignant neoplasm, site unspecified: Secondary | ICD-10-CM | POA: Diagnosis not present

## 2021-07-09 DIAGNOSIS — D692 Other nonthrombocytopenic purpura: Secondary | ICD-10-CM | POA: Diagnosis not present

## 2021-07-09 DIAGNOSIS — L821 Other seborrheic keratosis: Secondary | ICD-10-CM | POA: Diagnosis not present

## 2021-07-27 DIAGNOSIS — E785 Hyperlipidemia, unspecified: Secondary | ICD-10-CM

## 2021-07-27 DIAGNOSIS — I10 Essential (primary) hypertension: Secondary | ICD-10-CM

## 2021-08-10 DIAGNOSIS — I6389 Other cerebral infarction: Secondary | ICD-10-CM | POA: Diagnosis not present

## 2021-08-10 DIAGNOSIS — I6523 Occlusion and stenosis of bilateral carotid arteries: Secondary | ICD-10-CM | POA: Diagnosis not present

## 2021-08-10 DIAGNOSIS — I63412 Cerebral infarction due to embolism of left middle cerebral artery: Secondary | ICD-10-CM | POA: Diagnosis not present

## 2021-08-10 DIAGNOSIS — Z20822 Contact with and (suspected) exposure to covid-19: Secondary | ICD-10-CM | POA: Diagnosis not present

## 2021-08-10 DIAGNOSIS — B999 Unspecified infectious disease: Secondary | ICD-10-CM | POA: Diagnosis not present

## 2021-08-10 DIAGNOSIS — R4182 Altered mental status, unspecified: Secondary | ICD-10-CM | POA: Diagnosis not present

## 2021-08-10 DIAGNOSIS — A4151 Sepsis due to Escherichia coli [E. coli]: Secondary | ICD-10-CM | POA: Diagnosis not present

## 2021-08-10 DIAGNOSIS — R079 Chest pain, unspecified: Secondary | ICD-10-CM | POA: Diagnosis not present

## 2021-08-10 DIAGNOSIS — I69328 Other speech and language deficits following cerebral infarction: Secondary | ICD-10-CM | POA: Diagnosis not present

## 2021-08-10 DIAGNOSIS — I739 Peripheral vascular disease, unspecified: Secondary | ICD-10-CM | POA: Diagnosis not present

## 2021-08-10 DIAGNOSIS — Z515 Encounter for palliative care: Secondary | ICD-10-CM | POA: Diagnosis not present

## 2021-08-10 DIAGNOSIS — F419 Anxiety disorder, unspecified: Secondary | ICD-10-CM | POA: Diagnosis not present

## 2021-08-10 DIAGNOSIS — K8042 Calculus of bile duct with acute cholecystitis without obstruction: Secondary | ICD-10-CM | POA: Diagnosis not present

## 2021-08-10 DIAGNOSIS — Z7982 Long term (current) use of aspirin: Secondary | ICD-10-CM | POA: Diagnosis not present

## 2021-08-10 DIAGNOSIS — I6501 Occlusion and stenosis of right vertebral artery: Secondary | ICD-10-CM | POA: Diagnosis not present

## 2021-08-10 DIAGNOSIS — I119 Hypertensive heart disease without heart failure: Secondary | ICD-10-CM | POA: Diagnosis not present

## 2021-08-10 DIAGNOSIS — K8020 Calculus of gallbladder without cholecystitis without obstruction: Secondary | ICD-10-CM | POA: Diagnosis not present

## 2021-08-10 DIAGNOSIS — K8000 Calculus of gallbladder with acute cholecystitis without obstruction: Secondary | ICD-10-CM | POA: Diagnosis not present

## 2021-08-10 DIAGNOSIS — G9341 Metabolic encephalopathy: Secondary | ICD-10-CM | POA: Diagnosis not present

## 2021-08-10 DIAGNOSIS — K8070 Calculus of gallbladder and bile duct without cholecystitis without obstruction: Secondary | ICD-10-CM | POA: Diagnosis not present

## 2021-08-10 DIAGNOSIS — I6300 Cerebral infarction due to thrombosis of unspecified precerebral artery: Secondary | ICD-10-CM | POA: Diagnosis not present

## 2021-08-10 DIAGNOSIS — R410 Disorientation, unspecified: Secondary | ICD-10-CM | POA: Diagnosis not present

## 2021-08-10 DIAGNOSIS — K449 Diaphragmatic hernia without obstruction or gangrene: Secondary | ICD-10-CM | POA: Diagnosis not present

## 2021-08-10 DIAGNOSIS — R0789 Other chest pain: Secondary | ICD-10-CM | POA: Diagnosis not present

## 2021-08-10 DIAGNOSIS — Z9049 Acquired absence of other specified parts of digestive tract: Secondary | ICD-10-CM | POA: Diagnosis not present

## 2021-08-10 DIAGNOSIS — R7881 Bacteremia: Secondary | ICD-10-CM | POA: Diagnosis not present

## 2021-08-10 DIAGNOSIS — F32A Depression, unspecified: Secondary | ICD-10-CM | POA: Diagnosis not present

## 2021-08-10 DIAGNOSIS — I10 Essential (primary) hypertension: Secondary | ICD-10-CM | POA: Diagnosis not present

## 2021-08-10 DIAGNOSIS — R748 Abnormal levels of other serum enzymes: Secondary | ICD-10-CM | POA: Diagnosis not present

## 2021-08-10 DIAGNOSIS — I440 Atrioventricular block, first degree: Secondary | ICD-10-CM | POA: Diagnosis not present

## 2021-08-10 DIAGNOSIS — K7689 Other specified diseases of liver: Secondary | ICD-10-CM | POA: Diagnosis not present

## 2021-08-10 DIAGNOSIS — R7989 Other specified abnormal findings of blood chemistry: Secondary | ICD-10-CM | POA: Diagnosis not present

## 2021-08-10 DIAGNOSIS — G4489 Other headache syndrome: Secondary | ICD-10-CM | POA: Diagnosis not present

## 2021-08-10 DIAGNOSIS — K8063 Calculus of gallbladder and bile duct with acute cholecystitis with obstruction: Secondary | ICD-10-CM | POA: Diagnosis not present

## 2021-08-10 DIAGNOSIS — Z48815 Encounter for surgical aftercare following surgery on the digestive system: Secondary | ICD-10-CM | POA: Diagnosis not present

## 2021-08-10 DIAGNOSIS — R109 Unspecified abdominal pain: Secondary | ICD-10-CM | POA: Diagnosis not present

## 2021-08-10 DIAGNOSIS — R945 Abnormal results of liver function studies: Secondary | ICD-10-CM | POA: Diagnosis not present

## 2021-08-10 DIAGNOSIS — R7401 Elevation of levels of liver transaminase levels: Secondary | ICD-10-CM | POA: Diagnosis not present

## 2021-08-10 DIAGNOSIS — R1312 Dysphagia, oropharyngeal phase: Secondary | ICD-10-CM | POA: Diagnosis not present

## 2021-08-10 DIAGNOSIS — R1310 Dysphagia, unspecified: Secondary | ICD-10-CM | POA: Diagnosis not present

## 2021-08-10 DIAGNOSIS — Z7902 Long term (current) use of antithrombotics/antiplatelets: Secondary | ICD-10-CM | POA: Diagnosis not present

## 2021-08-10 DIAGNOSIS — K6389 Other specified diseases of intestine: Secondary | ICD-10-CM | POA: Diagnosis not present

## 2021-08-10 DIAGNOSIS — E872 Acidosis, unspecified: Secondary | ICD-10-CM | POA: Diagnosis not present

## 2021-08-10 DIAGNOSIS — K8051 Calculus of bile duct without cholangitis or cholecystitis with obstruction: Secondary | ICD-10-CM | POA: Diagnosis not present

## 2021-08-10 DIAGNOSIS — I2510 Atherosclerotic heart disease of native coronary artery without angina pectoris: Secondary | ICD-10-CM | POA: Diagnosis not present

## 2021-08-10 DIAGNOSIS — K8071 Calculus of gallbladder and bile duct without cholecystitis with obstruction: Secondary | ICD-10-CM | POA: Diagnosis not present

## 2021-08-10 DIAGNOSIS — I340 Nonrheumatic mitral (valve) insufficiency: Secondary | ICD-10-CM | POA: Diagnosis not present

## 2021-08-10 DIAGNOSIS — Z743 Need for continuous supervision: Secondary | ICD-10-CM | POA: Diagnosis not present

## 2021-08-10 DIAGNOSIS — I69314 Frontal lobe and executive function deficit following cerebral infarction: Secondary | ICD-10-CM | POA: Diagnosis not present

## 2021-08-10 DIAGNOSIS — I452 Bifascicular block: Secondary | ICD-10-CM | POA: Diagnosis not present

## 2021-08-10 DIAGNOSIS — E038 Other specified hypothyroidism: Secondary | ICD-10-CM | POA: Diagnosis not present

## 2021-08-10 DIAGNOSIS — I69391 Dysphagia following cerebral infarction: Secondary | ICD-10-CM | POA: Diagnosis not present

## 2021-08-10 DIAGNOSIS — Z951 Presence of aortocoronary bypass graft: Secondary | ICD-10-CM | POA: Diagnosis not present

## 2021-08-10 DIAGNOSIS — K8043 Calculus of bile duct with acute cholecystitis with obstruction: Secondary | ICD-10-CM | POA: Diagnosis not present

## 2021-08-10 DIAGNOSIS — R404 Transient alteration of awareness: Secondary | ICD-10-CM | POA: Diagnosis not present

## 2021-08-10 DIAGNOSIS — G319 Degenerative disease of nervous system, unspecified: Secondary | ICD-10-CM | POA: Diagnosis not present

## 2021-08-10 DIAGNOSIS — G9389 Other specified disorders of brain: Secondary | ICD-10-CM | POA: Diagnosis not present

## 2021-08-10 DIAGNOSIS — K4090 Unilateral inguinal hernia, without obstruction or gangrene, not specified as recurrent: Secondary | ICD-10-CM | POA: Diagnosis not present

## 2021-08-10 DIAGNOSIS — Z7189 Other specified counseling: Secondary | ICD-10-CM | POA: Diagnosis not present

## 2021-08-10 DIAGNOSIS — K769 Liver disease, unspecified: Secondary | ICD-10-CM | POA: Diagnosis not present

## 2021-08-10 DIAGNOSIS — K219 Gastro-esophageal reflux disease without esophagitis: Secondary | ICD-10-CM | POA: Diagnosis not present

## 2021-08-10 DIAGNOSIS — E876 Hypokalemia: Secondary | ICD-10-CM | POA: Diagnosis not present

## 2021-08-10 DIAGNOSIS — I517 Cardiomegaly: Secondary | ICD-10-CM | POA: Diagnosis not present

## 2021-08-10 DIAGNOSIS — N281 Cyst of kidney, acquired: Secondary | ICD-10-CM | POA: Diagnosis not present

## 2021-08-10 DIAGNOSIS — R1084 Generalized abdominal pain: Secondary | ICD-10-CM | POA: Diagnosis not present

## 2021-08-10 DIAGNOSIS — I63512 Cerebral infarction due to unspecified occlusion or stenosis of left middle cerebral artery: Secondary | ICD-10-CM | POA: Diagnosis not present

## 2021-08-10 DIAGNOSIS — A419 Sepsis, unspecified organism: Secondary | ICD-10-CM | POA: Diagnosis not present

## 2021-08-10 DIAGNOSIS — R9082 White matter disease, unspecified: Secondary | ICD-10-CM | POA: Diagnosis not present

## 2021-08-10 DIAGNOSIS — K8050 Calculus of bile duct without cholangitis or cholecystitis without obstruction: Secondary | ICD-10-CM | POA: Diagnosis not present

## 2021-08-10 DIAGNOSIS — I6782 Cerebral ischemia: Secondary | ICD-10-CM | POA: Diagnosis not present

## 2021-08-10 DIAGNOSIS — I639 Cerebral infarction, unspecified: Secondary | ICD-10-CM | POA: Diagnosis not present

## 2021-08-10 DIAGNOSIS — I252 Old myocardial infarction: Secondary | ICD-10-CM | POA: Diagnosis not present

## 2021-08-10 DIAGNOSIS — Z955 Presence of coronary angioplasty implant and graft: Secondary | ICD-10-CM | POA: Diagnosis not present

## 2021-08-10 DIAGNOSIS — I7121 Aneurysm of the ascending aorta, without rupture: Secondary | ICD-10-CM | POA: Diagnosis not present

## 2021-08-10 DIAGNOSIS — K831 Obstruction of bile duct: Secondary | ICD-10-CM | POA: Diagnosis not present

## 2021-08-10 DIAGNOSIS — G459 Transient cerebral ischemic attack, unspecified: Secondary | ICD-10-CM | POA: Diagnosis not present

## 2021-08-10 DIAGNOSIS — J90 Pleural effusion, not elsewhere classified: Secondary | ICD-10-CM | POA: Diagnosis not present

## 2021-08-21 DIAGNOSIS — K388 Other specified diseases of appendix: Secondary | ICD-10-CM | POA: Diagnosis not present

## 2021-08-21 DIAGNOSIS — Z87820 Personal history of traumatic brain injury: Secondary | ICD-10-CM | POA: Diagnosis not present

## 2021-08-21 DIAGNOSIS — R410 Disorientation, unspecified: Secondary | ICD-10-CM | POA: Diagnosis not present

## 2021-08-21 DIAGNOSIS — A419 Sepsis, unspecified organism: Secondary | ICD-10-CM | POA: Diagnosis not present

## 2021-08-21 DIAGNOSIS — Z7189 Other specified counseling: Secondary | ICD-10-CM | POA: Diagnosis not present

## 2021-08-21 DIAGNOSIS — I69328 Other speech and language deficits following cerebral infarction: Secondary | ICD-10-CM | POA: Diagnosis not present

## 2021-08-21 DIAGNOSIS — R9082 White matter disease, unspecified: Secondary | ICD-10-CM | POA: Diagnosis not present

## 2021-08-21 DIAGNOSIS — K8000 Calculus of gallbladder with acute cholecystitis without obstruction: Secondary | ICD-10-CM | POA: Diagnosis not present

## 2021-08-21 DIAGNOSIS — Z7982 Long term (current) use of aspirin: Secondary | ICD-10-CM | POA: Diagnosis not present

## 2021-08-21 DIAGNOSIS — G459 Transient cerebral ischemic attack, unspecified: Secondary | ICD-10-CM | POA: Diagnosis not present

## 2021-08-21 DIAGNOSIS — I6389 Other cerebral infarction: Secondary | ICD-10-CM | POA: Diagnosis not present

## 2021-08-21 DIAGNOSIS — R4781 Slurred speech: Secondary | ICD-10-CM | POA: Diagnosis not present

## 2021-08-21 DIAGNOSIS — Z951 Presence of aortocoronary bypass graft: Secondary | ICD-10-CM | POA: Diagnosis not present

## 2021-08-21 DIAGNOSIS — Z9049 Acquired absence of other specified parts of digestive tract: Secondary | ICD-10-CM | POA: Diagnosis not present

## 2021-08-21 DIAGNOSIS — R29810 Facial weakness: Secondary | ICD-10-CM | POA: Diagnosis not present

## 2021-08-21 DIAGNOSIS — Z79899 Other long term (current) drug therapy: Secondary | ICD-10-CM | POA: Diagnosis not present

## 2021-08-21 DIAGNOSIS — R1310 Dysphagia, unspecified: Secondary | ICD-10-CM | POA: Diagnosis not present

## 2021-08-21 DIAGNOSIS — Z48815 Encounter for surgical aftercare following surgery on the digestive system: Secondary | ICD-10-CM | POA: Diagnosis not present

## 2021-08-21 DIAGNOSIS — N281 Cyst of kidney, acquired: Secondary | ICD-10-CM | POA: Diagnosis not present

## 2021-08-21 DIAGNOSIS — F4321 Adjustment disorder with depressed mood: Secondary | ICD-10-CM | POA: Diagnosis not present

## 2021-08-21 DIAGNOSIS — Z7902 Long term (current) use of antithrombotics/antiplatelets: Secondary | ICD-10-CM | POA: Diagnosis not present

## 2021-08-21 DIAGNOSIS — G9389 Other specified disorders of brain: Secondary | ICD-10-CM | POA: Diagnosis not present

## 2021-08-21 DIAGNOSIS — K8050 Calculus of bile duct without cholangitis or cholecystitis without obstruction: Secondary | ICD-10-CM | POA: Diagnosis not present

## 2021-08-21 DIAGNOSIS — R1084 Generalized abdominal pain: Secondary | ICD-10-CM | POA: Diagnosis not present

## 2021-08-21 DIAGNOSIS — K8063 Calculus of gallbladder and bile duct with acute cholecystitis with obstruction: Secondary | ICD-10-CM | POA: Diagnosis not present

## 2021-08-21 DIAGNOSIS — F419 Anxiety disorder, unspecified: Secondary | ICD-10-CM | POA: Diagnosis not present

## 2021-08-21 DIAGNOSIS — K2289 Other specified disease of esophagus: Secondary | ICD-10-CM | POA: Diagnosis not present

## 2021-08-21 DIAGNOSIS — K922 Gastrointestinal hemorrhage, unspecified: Secondary | ICD-10-CM | POA: Diagnosis not present

## 2021-08-21 DIAGNOSIS — I70219 Atherosclerosis of native arteries of extremities with intermittent claudication, unspecified extremity: Secondary | ICD-10-CM | POA: Diagnosis not present

## 2021-08-21 DIAGNOSIS — R1312 Dysphagia, oropharyngeal phase: Secondary | ICD-10-CM | POA: Diagnosis not present

## 2021-08-21 DIAGNOSIS — I6300 Cerebral infarction due to thrombosis of unspecified precerebral artery: Secondary | ICD-10-CM | POA: Diagnosis not present

## 2021-08-21 DIAGNOSIS — I119 Hypertensive heart disease without heart failure: Secondary | ICD-10-CM | POA: Diagnosis not present

## 2021-08-21 DIAGNOSIS — E0590 Thyrotoxicosis, unspecified without thyrotoxic crisis or storm: Secondary | ICD-10-CM | POA: Diagnosis not present

## 2021-08-21 DIAGNOSIS — I69391 Dysphagia following cerebral infarction: Secondary | ICD-10-CM | POA: Diagnosis not present

## 2021-08-21 DIAGNOSIS — N200 Calculus of kidney: Secondary | ICD-10-CM | POA: Diagnosis not present

## 2021-08-21 DIAGNOSIS — R4701 Aphasia: Secondary | ICD-10-CM | POA: Diagnosis not present

## 2021-08-21 DIAGNOSIS — I63412 Cerebral infarction due to embolism of left middle cerebral artery: Secondary | ICD-10-CM | POA: Diagnosis not present

## 2021-08-21 DIAGNOSIS — E785 Hyperlipidemia, unspecified: Secondary | ICD-10-CM | POA: Diagnosis not present

## 2021-08-21 DIAGNOSIS — F32A Depression, unspecified: Secondary | ICD-10-CM | POA: Diagnosis not present

## 2021-08-21 DIAGNOSIS — I340 Nonrheumatic mitral (valve) insufficiency: Secondary | ICD-10-CM | POA: Diagnosis not present

## 2021-08-21 DIAGNOSIS — G319 Degenerative disease of nervous system, unspecified: Secondary | ICD-10-CM | POA: Diagnosis not present

## 2021-08-21 DIAGNOSIS — K8020 Calculus of gallbladder without cholecystitis without obstruction: Secondary | ICD-10-CM | POA: Diagnosis not present

## 2021-08-21 DIAGNOSIS — Z955 Presence of coronary angioplasty implant and graft: Secondary | ICD-10-CM | POA: Diagnosis not present

## 2021-08-21 DIAGNOSIS — I452 Bifascicular block: Secondary | ICD-10-CM | POA: Diagnosis not present

## 2021-08-21 DIAGNOSIS — I440 Atrioventricular block, first degree: Secondary | ICD-10-CM | POA: Diagnosis not present

## 2021-08-21 DIAGNOSIS — Z515 Encounter for palliative care: Secondary | ICD-10-CM | POA: Diagnosis not present

## 2021-08-21 DIAGNOSIS — D649 Anemia, unspecified: Secondary | ICD-10-CM | POA: Diagnosis not present

## 2021-08-21 DIAGNOSIS — E876 Hypokalemia: Secondary | ICD-10-CM | POA: Diagnosis not present

## 2021-08-21 DIAGNOSIS — G9341 Metabolic encephalopathy: Secondary | ICD-10-CM | POA: Diagnosis not present

## 2021-08-21 DIAGNOSIS — F0390 Unspecified dementia without behavioral disturbance: Secondary | ICD-10-CM | POA: Diagnosis not present

## 2021-08-21 DIAGNOSIS — Z743 Need for continuous supervision: Secondary | ICD-10-CM | POA: Diagnosis not present

## 2021-08-21 DIAGNOSIS — I10 Essential (primary) hypertension: Secondary | ICD-10-CM | POA: Diagnosis not present

## 2021-08-21 DIAGNOSIS — I252 Old myocardial infarction: Secondary | ICD-10-CM | POA: Diagnosis not present

## 2021-08-21 DIAGNOSIS — R4182 Altered mental status, unspecified: Secondary | ICD-10-CM | POA: Diagnosis not present

## 2021-08-21 DIAGNOSIS — I160 Hypertensive urgency: Secondary | ICD-10-CM | POA: Diagnosis not present

## 2021-08-21 DIAGNOSIS — I2510 Atherosclerotic heart disease of native coronary artery without angina pectoris: Secondary | ICD-10-CM | POA: Diagnosis not present

## 2021-08-21 DIAGNOSIS — K219 Gastro-esophageal reflux disease without esophagitis: Secondary | ICD-10-CM | POA: Diagnosis not present

## 2021-08-21 DIAGNOSIS — R0902 Hypoxemia: Secondary | ICD-10-CM | POA: Diagnosis not present

## 2021-08-21 DIAGNOSIS — G9340 Encephalopathy, unspecified: Secondary | ICD-10-CM | POA: Diagnosis not present

## 2021-08-21 DIAGNOSIS — I69314 Frontal lobe and executive function deficit following cerebral infarction: Secondary | ICD-10-CM | POA: Diagnosis not present

## 2021-08-22 DIAGNOSIS — K8000 Calculus of gallbladder with acute cholecystitis without obstruction: Secondary | ICD-10-CM | POA: Diagnosis not present

## 2021-08-22 DIAGNOSIS — A419 Sepsis, unspecified organism: Secondary | ICD-10-CM | POA: Diagnosis not present

## 2021-08-22 DIAGNOSIS — R1310 Dysphagia, unspecified: Secondary | ICD-10-CM | POA: Diagnosis not present

## 2021-08-22 DIAGNOSIS — I63412 Cerebral infarction due to embolism of left middle cerebral artery: Secondary | ICD-10-CM | POA: Diagnosis not present

## 2021-09-03 DIAGNOSIS — I69314 Frontal lobe and executive function deficit following cerebral infarction: Secondary | ICD-10-CM | POA: Diagnosis not present

## 2021-09-03 DIAGNOSIS — I6300 Cerebral infarction due to thrombosis of unspecified precerebral artery: Secondary | ICD-10-CM | POA: Diagnosis not present

## 2021-09-03 DIAGNOSIS — R9082 White matter disease, unspecified: Secondary | ICD-10-CM | POA: Diagnosis not present

## 2021-09-03 DIAGNOSIS — K219 Gastro-esophageal reflux disease without esophagitis: Secondary | ICD-10-CM | POA: Diagnosis not present

## 2021-09-03 DIAGNOSIS — K8050 Calculus of bile duct without cholangitis or cholecystitis without obstruction: Secondary | ICD-10-CM | POA: Diagnosis not present

## 2021-09-03 DIAGNOSIS — Z7902 Long term (current) use of antithrombotics/antiplatelets: Secondary | ICD-10-CM | POA: Diagnosis not present

## 2021-09-03 DIAGNOSIS — I452 Bifascicular block: Secondary | ICD-10-CM | POA: Diagnosis not present

## 2021-09-03 DIAGNOSIS — I70219 Atherosclerosis of native arteries of extremities with intermittent claudication, unspecified extremity: Secondary | ICD-10-CM | POA: Diagnosis not present

## 2021-09-03 DIAGNOSIS — G9389 Other specified disorders of brain: Secondary | ICD-10-CM | POA: Diagnosis not present

## 2021-09-03 DIAGNOSIS — K2289 Other specified disease of esophagus: Secondary | ICD-10-CM | POA: Diagnosis not present

## 2021-09-03 DIAGNOSIS — N281 Cyst of kidney, acquired: Secondary | ICD-10-CM | POA: Diagnosis not present

## 2021-09-03 DIAGNOSIS — Z7982 Long term (current) use of aspirin: Secondary | ICD-10-CM | POA: Diagnosis not present

## 2021-09-03 DIAGNOSIS — K8020 Calculus of gallbladder without cholecystitis without obstruction: Secondary | ICD-10-CM | POA: Diagnosis not present

## 2021-09-03 DIAGNOSIS — E876 Hypokalemia: Secondary | ICD-10-CM | POA: Diagnosis not present

## 2021-09-03 DIAGNOSIS — I10 Essential (primary) hypertension: Secondary | ICD-10-CM | POA: Diagnosis not present

## 2021-09-03 DIAGNOSIS — G9341 Metabolic encephalopathy: Secondary | ICD-10-CM | POA: Diagnosis not present

## 2021-09-03 DIAGNOSIS — F0390 Unspecified dementia without behavioral disturbance: Secondary | ICD-10-CM | POA: Diagnosis not present

## 2021-09-03 DIAGNOSIS — R4701 Aphasia: Secondary | ICD-10-CM | POA: Diagnosis not present

## 2021-09-03 DIAGNOSIS — I252 Old myocardial infarction: Secondary | ICD-10-CM | POA: Diagnosis not present

## 2021-09-03 DIAGNOSIS — I119 Hypertensive heart disease without heart failure: Secondary | ICD-10-CM | POA: Diagnosis not present

## 2021-09-03 DIAGNOSIS — F419 Anxiety disorder, unspecified: Secondary | ICD-10-CM | POA: Diagnosis not present

## 2021-09-03 DIAGNOSIS — K388 Other specified diseases of appendix: Secondary | ICD-10-CM | POA: Diagnosis not present

## 2021-09-03 DIAGNOSIS — R1084 Generalized abdominal pain: Secondary | ICD-10-CM | POA: Diagnosis not present

## 2021-09-03 DIAGNOSIS — Z87820 Personal history of traumatic brain injury: Secondary | ICD-10-CM | POA: Diagnosis not present

## 2021-09-03 DIAGNOSIS — E0590 Thyrotoxicosis, unspecified without thyrotoxic crisis or storm: Secondary | ICD-10-CM | POA: Diagnosis not present

## 2021-09-03 DIAGNOSIS — I69391 Dysphagia following cerebral infarction: Secondary | ICD-10-CM | POA: Diagnosis not present

## 2021-09-03 DIAGNOSIS — Z951 Presence of aortocoronary bypass graft: Secondary | ICD-10-CM | POA: Diagnosis not present

## 2021-09-03 DIAGNOSIS — G319 Degenerative disease of nervous system, unspecified: Secondary | ICD-10-CM | POA: Diagnosis not present

## 2021-09-03 DIAGNOSIS — I160 Hypertensive urgency: Secondary | ICD-10-CM | POA: Diagnosis not present

## 2021-09-03 DIAGNOSIS — R1312 Dysphagia, oropharyngeal phase: Secondary | ICD-10-CM | POA: Diagnosis not present

## 2021-09-03 DIAGNOSIS — R0902 Hypoxemia: Secondary | ICD-10-CM | POA: Diagnosis not present

## 2021-09-03 DIAGNOSIS — Z79899 Other long term (current) drug therapy: Secondary | ICD-10-CM | POA: Diagnosis not present

## 2021-09-03 DIAGNOSIS — Z955 Presence of coronary angioplasty implant and graft: Secondary | ICD-10-CM | POA: Diagnosis not present

## 2021-09-03 DIAGNOSIS — I2510 Atherosclerotic heart disease of native coronary artery without angina pectoris: Secondary | ICD-10-CM | POA: Diagnosis not present

## 2021-09-03 DIAGNOSIS — I340 Nonrheumatic mitral (valve) insufficiency: Secondary | ICD-10-CM | POA: Diagnosis not present

## 2021-09-03 DIAGNOSIS — I440 Atrioventricular block, first degree: Secondary | ICD-10-CM | POA: Diagnosis not present

## 2021-09-03 DIAGNOSIS — I69328 Other speech and language deficits following cerebral infarction: Secondary | ICD-10-CM | POA: Diagnosis not present

## 2021-09-03 DIAGNOSIS — Z515 Encounter for palliative care: Secondary | ICD-10-CM | POA: Diagnosis not present

## 2021-09-03 DIAGNOSIS — R4781 Slurred speech: Secondary | ICD-10-CM | POA: Diagnosis not present

## 2021-09-03 DIAGNOSIS — G9340 Encephalopathy, unspecified: Secondary | ICD-10-CM | POA: Diagnosis not present

## 2021-09-03 DIAGNOSIS — Z9049 Acquired absence of other specified parts of digestive tract: Secondary | ICD-10-CM | POA: Diagnosis not present

## 2021-09-03 DIAGNOSIS — R4182 Altered mental status, unspecified: Secondary | ICD-10-CM | POA: Diagnosis not present

## 2021-09-03 DIAGNOSIS — K922 Gastrointestinal hemorrhage, unspecified: Secondary | ICD-10-CM | POA: Diagnosis not present

## 2021-09-03 DIAGNOSIS — R29810 Facial weakness: Secondary | ICD-10-CM | POA: Diagnosis not present

## 2021-09-03 DIAGNOSIS — N200 Calculus of kidney: Secondary | ICD-10-CM | POA: Diagnosis not present

## 2021-09-03 DIAGNOSIS — I6389 Other cerebral infarction: Secondary | ICD-10-CM | POA: Diagnosis not present

## 2021-09-03 DIAGNOSIS — E785 Hyperlipidemia, unspecified: Secondary | ICD-10-CM | POA: Diagnosis not present

## 2021-09-08 DIAGNOSIS — I69391 Dysphagia following cerebral infarction: Secondary | ICD-10-CM | POA: Diagnosis not present

## 2021-09-08 DIAGNOSIS — G9341 Metabolic encephalopathy: Secondary | ICD-10-CM | POA: Diagnosis not present

## 2021-09-08 DIAGNOSIS — Z9049 Acquired absence of other specified parts of digestive tract: Secondary | ICD-10-CM | POA: Diagnosis not present

## 2021-09-08 DIAGNOSIS — F4321 Adjustment disorder with depressed mood: Secondary | ICD-10-CM | POA: Diagnosis not present

## 2021-09-08 DIAGNOSIS — I440 Atrioventricular block, first degree: Secondary | ICD-10-CM | POA: Diagnosis not present

## 2021-09-08 DIAGNOSIS — R1011 Right upper quadrant pain: Secondary | ICD-10-CM | POA: Diagnosis not present

## 2021-09-08 DIAGNOSIS — Z7902 Long term (current) use of antithrombotics/antiplatelets: Secondary | ICD-10-CM | POA: Diagnosis not present

## 2021-09-08 DIAGNOSIS — I69328 Other speech and language deficits following cerebral infarction: Secondary | ICD-10-CM | POA: Diagnosis not present

## 2021-09-08 DIAGNOSIS — R4182 Altered mental status, unspecified: Secondary | ICD-10-CM | POA: Diagnosis not present

## 2021-09-08 DIAGNOSIS — F0390 Unspecified dementia without behavioral disturbance: Secondary | ICD-10-CM | POA: Diagnosis not present

## 2021-09-08 DIAGNOSIS — R1312 Dysphagia, oropharyngeal phase: Secondary | ICD-10-CM | POA: Diagnosis not present

## 2021-09-08 DIAGNOSIS — A4150 Gram-negative sepsis, unspecified: Secondary | ICD-10-CM | POA: Diagnosis not present

## 2021-09-08 DIAGNOSIS — Z7982 Long term (current) use of aspirin: Secondary | ICD-10-CM | POA: Diagnosis not present

## 2021-09-08 DIAGNOSIS — I452 Bifascicular block: Secondary | ICD-10-CM | POA: Diagnosis not present

## 2021-09-08 DIAGNOSIS — E785 Hyperlipidemia, unspecified: Secondary | ICD-10-CM | POA: Diagnosis not present

## 2021-09-08 DIAGNOSIS — R410 Disorientation, unspecified: Secondary | ICD-10-CM | POA: Diagnosis not present

## 2021-09-08 DIAGNOSIS — R0602 Shortness of breath: Secondary | ICD-10-CM | POA: Diagnosis not present

## 2021-09-08 DIAGNOSIS — K8309 Other cholangitis: Secondary | ICD-10-CM | POA: Diagnosis not present

## 2021-09-08 DIAGNOSIS — R6520 Severe sepsis without septic shock: Secondary | ICD-10-CM | POA: Diagnosis not present

## 2021-09-08 DIAGNOSIS — F419 Anxiety disorder, unspecified: Secondary | ICD-10-CM | POA: Diagnosis not present

## 2021-09-08 DIAGNOSIS — I119 Hypertensive heart disease without heart failure: Secondary | ICD-10-CM | POA: Diagnosis not present

## 2021-09-08 DIAGNOSIS — Z79899 Other long term (current) drug therapy: Secondary | ICD-10-CM | POA: Diagnosis not present

## 2021-09-08 DIAGNOSIS — I69314 Frontal lobe and executive function deficit following cerebral infarction: Secondary | ICD-10-CM | POA: Diagnosis not present

## 2021-09-08 DIAGNOSIS — Z515 Encounter for palliative care: Secondary | ICD-10-CM | POA: Diagnosis not present

## 2021-09-08 DIAGNOSIS — I340 Nonrheumatic mitral (valve) insufficiency: Secondary | ICD-10-CM | POA: Diagnosis not present

## 2021-09-08 DIAGNOSIS — R400 Somnolence: Secondary | ICD-10-CM | POA: Diagnosis not present

## 2021-09-08 DIAGNOSIS — E559 Vitamin D deficiency, unspecified: Secondary | ICD-10-CM | POA: Diagnosis not present

## 2021-09-08 DIAGNOSIS — G9340 Encephalopathy, unspecified: Secondary | ICD-10-CM | POA: Diagnosis not present

## 2021-09-08 DIAGNOSIS — I959 Hypotension, unspecified: Secondary | ICD-10-CM | POA: Diagnosis not present

## 2021-09-08 DIAGNOSIS — K3189 Other diseases of stomach and duodenum: Secondary | ICD-10-CM | POA: Diagnosis not present

## 2021-09-08 DIAGNOSIS — N281 Cyst of kidney, acquired: Secondary | ICD-10-CM | POA: Diagnosis not present

## 2021-09-08 DIAGNOSIS — I252 Old myocardial infarction: Secondary | ICD-10-CM | POA: Diagnosis not present

## 2021-09-08 DIAGNOSIS — B9620 Unspecified Escherichia coli [E. coli] as the cause of diseases classified elsewhere: Secondary | ICD-10-CM | POA: Diagnosis not present

## 2021-09-08 DIAGNOSIS — R531 Weakness: Secondary | ICD-10-CM | POA: Diagnosis not present

## 2021-09-08 DIAGNOSIS — Z951 Presence of aortocoronary bypass graft: Secondary | ICD-10-CM | POA: Diagnosis not present

## 2021-09-08 DIAGNOSIS — K828 Other specified diseases of gallbladder: Secondary | ICD-10-CM | POA: Diagnosis not present

## 2021-09-08 DIAGNOSIS — I2510 Atherosclerotic heart disease of native coronary artery without angina pectoris: Secondary | ICD-10-CM | POA: Diagnosis not present

## 2021-09-08 DIAGNOSIS — R7881 Bacteremia: Secondary | ICD-10-CM | POA: Diagnosis not present

## 2021-09-08 DIAGNOSIS — A419 Sepsis, unspecified organism: Secondary | ICD-10-CM | POA: Diagnosis not present

## 2021-09-17 DIAGNOSIS — K8309 Other cholangitis: Secondary | ICD-10-CM | POA: Diagnosis not present

## 2021-09-17 DIAGNOSIS — Z515 Encounter for palliative care: Secondary | ICD-10-CM | POA: Diagnosis not present

## 2021-09-17 DIAGNOSIS — B9620 Unspecified Escherichia coli [E. coli] as the cause of diseases classified elsewhere: Secondary | ICD-10-CM | POA: Diagnosis not present

## 2021-09-17 DIAGNOSIS — R7881 Bacteremia: Secondary | ICD-10-CM | POA: Diagnosis not present

## 2021-09-19 DIAGNOSIS — N281 Cyst of kidney, acquired: Secondary | ICD-10-CM | POA: Diagnosis not present

## 2021-09-19 DIAGNOSIS — I440 Atrioventricular block, first degree: Secondary | ICD-10-CM | POA: Diagnosis not present

## 2021-09-19 DIAGNOSIS — R4182 Altered mental status, unspecified: Secondary | ICD-10-CM | POA: Diagnosis not present

## 2021-09-19 DIAGNOSIS — R410 Disorientation, unspecified: Secondary | ICD-10-CM | POA: Diagnosis not present

## 2021-09-19 DIAGNOSIS — K8309 Other cholangitis: Secondary | ICD-10-CM | POA: Diagnosis not present

## 2021-09-19 DIAGNOSIS — R0602 Shortness of breath: Secondary | ICD-10-CM | POA: Diagnosis not present

## 2021-09-19 DIAGNOSIS — R1011 Right upper quadrant pain: Secondary | ICD-10-CM | POA: Diagnosis not present

## 2021-09-19 DIAGNOSIS — K828 Other specified diseases of gallbladder: Secondary | ICD-10-CM | POA: Diagnosis not present

## 2021-09-19 DIAGNOSIS — K3189 Other diseases of stomach and duodenum: Secondary | ICD-10-CM | POA: Diagnosis not present

## 2021-09-20 DIAGNOSIS — A419 Sepsis, unspecified organism: Secondary | ICD-10-CM | POA: Diagnosis not present

## 2021-09-20 DIAGNOSIS — Z452 Encounter for adjustment and management of vascular access device: Secondary | ICD-10-CM | POA: Diagnosis not present

## 2021-09-20 DIAGNOSIS — G9340 Encephalopathy, unspecified: Secondary | ICD-10-CM | POA: Diagnosis not present

## 2021-09-20 DIAGNOSIS — A4150 Gram-negative sepsis, unspecified: Secondary | ICD-10-CM | POA: Diagnosis not present

## 2021-09-20 DIAGNOSIS — K8309 Other cholangitis: Secondary | ICD-10-CM | POA: Diagnosis not present

## 2021-09-20 DIAGNOSIS — K8050 Calculus of bile duct without cholangitis or cholecystitis without obstruction: Secondary | ICD-10-CM | POA: Diagnosis not present

## 2021-09-21 DIAGNOSIS — K8033 Calculus of bile duct with acute cholangitis with obstruction: Secondary | ICD-10-CM | POA: Diagnosis not present

## 2021-09-21 DIAGNOSIS — A419 Sepsis, unspecified organism: Secondary | ICD-10-CM | POA: Diagnosis not present

## 2021-09-21 DIAGNOSIS — G9340 Encephalopathy, unspecified: Secondary | ICD-10-CM | POA: Diagnosis not present

## 2021-09-21 DIAGNOSIS — K851 Biliary acute pancreatitis without necrosis or infection: Secondary | ICD-10-CM | POA: Diagnosis not present

## 2021-09-22 DIAGNOSIS — A419 Sepsis, unspecified organism: Secondary | ICD-10-CM | POA: Diagnosis not present

## 2021-09-22 DIAGNOSIS — Z9689 Presence of other specified functional implants: Secondary | ICD-10-CM | POA: Diagnosis not present

## 2021-09-22 DIAGNOSIS — K5989 Other specified functional intestinal disorders: Secondary | ICD-10-CM | POA: Diagnosis not present

## 2021-09-22 DIAGNOSIS — R6521 Severe sepsis with septic shock: Secondary | ICD-10-CM | POA: Diagnosis not present

## 2021-09-22 DIAGNOSIS — R7401 Elevation of levels of liver transaminase levels: Secondary | ICD-10-CM | POA: Diagnosis not present

## 2021-09-22 DIAGNOSIS — K8309 Other cholangitis: Secondary | ICD-10-CM | POA: Diagnosis not present

## 2021-09-23 DIAGNOSIS — K5989 Other specified functional intestinal disorders: Secondary | ICD-10-CM | POA: Diagnosis not present

## 2021-09-23 DIAGNOSIS — K4090 Unilateral inguinal hernia, without obstruction or gangrene, not specified as recurrent: Secondary | ICD-10-CM | POA: Diagnosis not present

## 2021-09-23 DIAGNOSIS — Z4682 Encounter for fitting and adjustment of non-vascular catheter: Secondary | ICD-10-CM | POA: Diagnosis not present

## 2021-09-23 DIAGNOSIS — N200 Calculus of kidney: Secondary | ICD-10-CM | POA: Diagnosis not present

## 2021-09-23 DIAGNOSIS — K8309 Other cholangitis: Secondary | ICD-10-CM | POA: Diagnosis not present

## 2021-09-23 DIAGNOSIS — K7689 Other specified diseases of liver: Secondary | ICD-10-CM | POA: Diagnosis not present

## 2021-09-24 DIAGNOSIS — K6389 Other specified diseases of intestine: Secondary | ICD-10-CM | POA: Diagnosis not present

## 2021-09-24 DIAGNOSIS — Z4682 Encounter for fitting and adjustment of non-vascular catheter: Secondary | ICD-10-CM | POA: Diagnosis not present

## 2021-09-24 DIAGNOSIS — K8309 Other cholangitis: Secondary | ICD-10-CM | POA: Diagnosis not present

## 2021-09-25 DIAGNOSIS — K8309 Other cholangitis: Secondary | ICD-10-CM | POA: Diagnosis not present

## 2021-09-25 DIAGNOSIS — R140 Abdominal distension (gaseous): Secondary | ICD-10-CM | POA: Diagnosis not present

## 2021-09-26 DIAGNOSIS — K6389 Other specified diseases of intestine: Secondary | ICD-10-CM | POA: Diagnosis not present

## 2021-09-26 DIAGNOSIS — R1310 Dysphagia, unspecified: Secondary | ICD-10-CM | POA: Diagnosis not present

## 2021-09-26 DIAGNOSIS — K8309 Other cholangitis: Secondary | ICD-10-CM | POA: Diagnosis not present

## 2021-09-27 DIAGNOSIS — K5989 Other specified functional intestinal disorders: Secondary | ICD-10-CM | POA: Diagnosis not present

## 2021-09-27 DIAGNOSIS — K6389 Other specified diseases of intestine: Secondary | ICD-10-CM | POA: Diagnosis not present

## 2021-09-27 DIAGNOSIS — K8309 Other cholangitis: Secondary | ICD-10-CM | POA: Diagnosis not present

## 2021-09-28 DIAGNOSIS — F0390 Unspecified dementia without behavioral disturbance: Secondary | ICD-10-CM | POA: Diagnosis not present

## 2021-09-28 DIAGNOSIS — Z515 Encounter for palliative care: Secondary | ICD-10-CM | POA: Diagnosis not present

## 2021-09-28 DIAGNOSIS — K8309 Other cholangitis: Secondary | ICD-10-CM | POA: Diagnosis not present

## 2021-09-28 DIAGNOSIS — K6389 Other specified diseases of intestine: Secondary | ICD-10-CM | POA: Diagnosis not present

## 2021-09-28 DIAGNOSIS — G9340 Encephalopathy, unspecified: Secondary | ICD-10-CM | POA: Diagnosis not present

## 2021-10-28 DEATH — deceased

## 2022-07-11 ENCOUNTER — Telehealth: Payer: Medicare Other
